# Patient Record
Sex: Male | Born: 1980 | Race: Black or African American | Hispanic: No | Marital: Single | Smoking: Never smoker
Health system: Southern US, Community
[De-identification: ages and names within clinical notes are randomized; demographics above are authoritative.]

## PROBLEM LIST (undated history)

## (undated) DIAGNOSIS — Z789 Other specified health status: Secondary | ICD-10-CM

## (undated) DIAGNOSIS — I2699 Other pulmonary embolism without acute cor pulmonale: Secondary | ICD-10-CM

## (undated) HISTORY — PX: WRIST FRACTURE SURGERY: SHX121

## (undated) HISTORY — PX: APPENDECTOMY: SHX54

## (undated) HISTORY — PX: OTHER SURGICAL HISTORY: SHX169

---

## 2010-04-03 ENCOUNTER — Inpatient Hospital Stay (HOSPITAL_COMMUNITY): Admission: EM | Admit: 2010-04-03 | Discharge: 2010-04-04 | Payer: Self-pay | Admitting: Emergency Medicine

## 2010-04-03 ENCOUNTER — Encounter (INDEPENDENT_AMBULATORY_CARE_PROVIDER_SITE_OTHER): Payer: Self-pay | Admitting: General Surgery

## 2010-12-30 LAB — URINALYSIS, ROUTINE W REFLEX MICROSCOPIC
Glucose, UA: NEGATIVE mg/dL
Hgb urine dipstick: NEGATIVE
pH: 8.5 — ABNORMAL HIGH (ref 5.0–8.0)

## 2010-12-30 LAB — CBC
Platelets: 138 10*3/uL — ABNORMAL LOW (ref 150–400)
RBC: 5.72 MIL/uL (ref 4.22–5.81)
RDW: 14 % (ref 11.5–15.5)
WBC: 15.2 10*3/uL — ABNORMAL HIGH (ref 4.0–10.5)

## 2010-12-30 LAB — COMPREHENSIVE METABOLIC PANEL
ALT: 29 U/L (ref 0–53)
Albumin: 4.2 g/dL (ref 3.5–5.2)
BUN: 9 mg/dL (ref 6–23)
CO2: 23 mEq/L (ref 19–32)
Calcium: 9.8 mg/dL (ref 8.4–10.5)
Creatinine, Ser: 1.08 mg/dL (ref 0.4–1.5)
GFR calc non Af Amer: >60 mL/min (ref >60)
Glucose, Bld: 132 mg/dL — ABNORMAL HIGH (ref 70–99)

## 2010-12-30 LAB — DIFFERENTIAL
Basophils Absolute: 0 10*3/uL (ref 0.0–0.1)
Basophils Relative: 0 % (ref 0–1)
Eosinophils Absolute: 0 10*3/uL (ref 0.0–0.7)
Eosinophils Relative: 0 % (ref 0–5)
Lymphocytes Relative: 6 % — ABNORMAL LOW (ref 12–46)
Monocytes Relative: 6 % (ref 3–12)
Neutrophils Relative %: 88 % — ABNORMAL HIGH (ref 43–77)

## 2016-01-08 ENCOUNTER — Emergency Department: Payer: Federal, State, Local not specified - PPO

## 2016-01-08 ENCOUNTER — Emergency Department
Admission: EM | Admit: 2016-01-08 | Discharge: 2016-01-08 | Disposition: A | Discharge status: Home / Self Care | Payer: Federal, State, Local not specified - PPO | Attending: Emergency Medicine | Admitting: Emergency Medicine

## 2016-01-08 ENCOUNTER — Encounter: Payer: Self-pay | Admitting: Emergency Medicine

## 2016-01-08 DIAGNOSIS — S5011XA Contusion of right forearm, initial encounter: Secondary | ICD-10-CM | POA: Insufficient documentation

## 2016-01-08 DIAGNOSIS — S52201A Unspecified fracture of shaft of right ulna, initial encounter for closed fracture: Secondary | ICD-10-CM

## 2016-01-08 DIAGNOSIS — Y998 Other external cause status: Secondary | ICD-10-CM | POA: Diagnosis not present

## 2016-01-08 DIAGNOSIS — S52251A Displaced comminuted fracture of shaft of ulna, right arm, initial encounter for closed fracture: Secondary | ICD-10-CM | POA: Insufficient documentation

## 2016-01-08 DIAGNOSIS — Y9389 Activity, other specified: Secondary | ICD-10-CM | POA: Diagnosis not present

## 2016-01-08 DIAGNOSIS — S52351A Displaced comminuted fracture of shaft of radius, right arm, initial encounter for closed fracture: Secondary | ICD-10-CM | POA: Diagnosis not present

## 2016-01-08 DIAGNOSIS — Y92410 Unspecified street and highway as the place of occurrence of the external cause: Secondary | ICD-10-CM | POA: Insufficient documentation

## 2016-01-08 DIAGNOSIS — S50811A Abrasion of right forearm, initial encounter: Secondary | ICD-10-CM | POA: Insufficient documentation

## 2016-01-08 DIAGNOSIS — S59911A Unspecified injury of right forearm, initial encounter: Secondary | ICD-10-CM | POA: Diagnosis present

## 2016-01-08 DIAGNOSIS — S5291XA Unspecified fracture of right forearm, initial encounter for closed fracture: Secondary | ICD-10-CM

## 2016-01-08 MED ORDER — MORPHINE SULFATE (PF) 4 MG/ML IV SOLN
4.0000 mg | Freq: Once | INTRAVENOUS | Status: AC
  Administered 2016-01-08: 4 mg via INTRAVENOUS

## 2016-01-08 MED ORDER — MORPHINE SULFATE (PF) 4 MG/ML IV SOLN
INTRAVENOUS | Status: AC
  Filled 2016-01-08: qty 1

## 2016-01-08 MED ORDER — OXYCODONE-ACETAMINOPHEN 5-325 MG PO TABS
1.0000 | ORAL_TABLET | Freq: Once | ORAL | Status: AC
  Administered 2016-01-08: 1 via ORAL
  Filled 2016-01-08: qty 1

## 2016-01-08 MED ORDER — SODIUM CHLORIDE 0.9 % IV BOLUS (SEPSIS)
1000.0000 mL | Freq: Once | INTRAVENOUS | Status: AC
  Administered 2016-01-08: 1000 mL via INTRAVENOUS

## 2016-01-08 MED ORDER — ONDANSETRON HCL 4 MG/2ML IJ SOLN
4.0000 mg | Freq: Once | INTRAMUSCULAR | Status: AC
  Administered 2016-01-08: 4 mg via INTRAVENOUS

## 2016-01-08 MED ORDER — OXYCODONE-ACETAMINOPHEN 5-325 MG PO TABS: 1.0000 | ORAL_TABLET | Freq: Four times a day (QID) | ORAL | Status: DC | PRN

## 2016-01-08 MED ORDER — ONDANSETRON HCL 4 MG/2ML IJ SOLN
INTRAMUSCULAR | Status: AC
  Filled 2016-01-08: qty 2
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
## 2016-01-08 NOTE — ED Notes (Signed)
Pt right arm immobilized in triage.
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
## 2016-01-08 NOTE — Discharge Instructions (Signed)
Cast or Splint Care °Casts and splints support injured limbs and keep bones from moving while they heal. It is important to care for your cast or splint at home.   °HOME CARE INSTRUCTIONS °· Keep the cast or splint uncovered during the drying period. It can take 24 to 48 hours to dry if it is made of plaster. A fiberglass cast will dry in less than 1 hour. °· Do not rest the cast on anything harder than a pillow for the first 24 hours. °· Do not put weight on your injured limb or apply pressure to the cast until your health care provider gives you permission. °· Keep the cast or splint dry. Wet casts or splints can lose their shape and may not support the limb as well. A wet cast that has lost its shape can also create harmful pressure on your skin when it dries. Also, wet skin can become infected. °· Cover the cast or splint with a plastic bag when bathing or when out in the rain or snow. If the cast is on the trunk of the body, take sponge baths until the cast is removed. °· If your cast does become wet, dry it with a towel or a blow dryer on the cool setting only. °· Keep your cast or splint clean. Soiled casts may be wiped with a moistened cloth. °· Do not place any hard or soft foreign objects under your cast or splint, such as cotton, toilet paper, lotion, or powder. °· Do not try to scratch the skin under the cast with any object. The object could get stuck inside the cast. Also, scratching could lead to an infection. If itching is a problem, use a blow dryer on a cool setting to relieve discomfort. °· Do not trim or cut your cast or remove padding from inside of it. °· Exercise all joints next to the injury that are not immobilized by the cast or splint. For example, if you have a long leg cast, exercise the hip joint and toes. If you have an arm cast or splint, exercise the shoulder, elbow, thumb, and fingers. °· Elevate your injured arm or leg on 1 or 2 pillows for the first 1 to 3 days to decrease  swelling and pain. It is best if you can comfortably elevate your cast so it is higher than your heart. °SEEK MEDICAL CARE IF:  °· Your cast or splint cracks. °· Your cast or splint is too tight or too loose. °· You have unbearable itching inside the cast. °· Your cast becomes wet or develops a soft spot or area. °· You have a bad smell coming from inside your cast. °· You get an object stuck under your cast. °· Your skin around the cast becomes red or raw. °· You have new pain or worsening pain after the cast has been applied. °SEEK IMMEDIATE MEDICAL CARE IF:  °· You have fluid leaking through the cast. °· You are unable to move your fingers or toes. °· You have discolored (blue or white), cool, painful, or very swollen fingers or toes beyond the cast. °· You have tingling or numbness around the injured area. °· You have severe pain or pressure under the cast. °· You have any difficulty with your breathing or have shortness of breath. °· You have chest pain. °  °This information is not intended to replace advice given to you by your health care provider. Make sure you discuss any questions you have with your health care   provider. °  °Document Released: 09/27/2000 Document Revised: 07/21/2013 Document Reviewed: 04/08/2013 °Elsevier Interactive Patient Education ©2016 [employer] ° °Forearm Fracture °A forearm fracture is a break in one or both of the bones of your arm that are between the elbow and the wrist. Your forearm is made up of two bones: °· Radius. This is the bone on the inside of your arm near your thumb. °· Ulna. This is the bone on the outside of your arm near your little finger. °Middle forearm fractures usually break both the radius and the ulna. Most forearm fractures that involve both the ulna and radius will require surgery. °CAUSES °Common causes of this type of fracture include: °· Falling on an outstretched arm. °· Accidents, such as a car or bike accident. °· A hard, direct hit to the middle  part of your arm. °RISK FACTORS °You may be at higher risk for this type of fracture if: °· You play contact sports. °· You have a condition that causes your bones to be weak or thin (osteoporosis). °SIGNS AND SYMPTOMS °A forearm fracture causes pain immediately after the injury. Other signs and symptoms include: °· An abnormal bend or bump in your arm (deformity). °· Swelling. °· Numbness or tingling. °· Tenderness. °· Inability to turn your hand from side to side (rotate). °· Bruising. °DIAGNOSIS °Your health care provider may diagnose a forearm fracture based on: °· Your symptoms. °· Your medical history, including any recent injury. °· A physical exam. Your health care provider will look for any deformity and feel for tenderness over the break. Your health care provider will also check whether the bones are out of place. °· An X-ray exam to confirm the diagnosis and learn more about the type of fracture. °TREATMENT °The goals of treatment are to get the bone or bones in proper position for healing and to keep the bones from moving so they will heal over time. Your treatment will depend on many factors, especially the type of fracture that you have. °· If the fractured bone or bones: °¨ Are in the correct position (nondisplaced), you may only need to wear a cast or a splint. °¨ Have a slightly displaced fracture, you may need to have the bones moved back into place manually (closed reduction) before the splint or cast is put on. °· You may have a temporary splint before you have a cast. The splint allows room for some swelling. After a few days, a cast can replace the splint. °· You may have to wear the cast for 6-8 weeks or as directed by your health care provider. °· The cast may be changed after about 3 weeks or as directed by your health care provider. °· After your cast is removed, you may need physical therapy to regain full movement in your wrist or elbow. °· You may need emergency surgery if you  have: °¨ A fractured bone or bones that are out of position (displaced). °¨ A fracture with multiple fragments (comminuted fracture). °¨ A fracture that breaks the skin (open fracture). This type of fracture may require surgical wires, plates, or screws to hold the bone or bones in place. °· You may have X-rays every couple of weeks to check on your healing. °HOME CARE INSTRUCTIONS °If You Have a Cast: °· Do not stick anything inside the cast to scratch your skin. Doing that increases your risk of infection. °· Check the skin around the cast every day. Report any concerns to your health care   provider. You may put lotion on dry skin around the edges of the cast. Do not apply lotion to the skin underneath the cast. °If You Have a Splint: °· Wear it as directed by your health care provider. Remove it only as directed by your health care provider. °· Loosen the splint if your fingers become numb and tingle, or if they turn cold and blue. °Bathing °· Cover the cast or splint with a watertight plastic bag to protect it from water while you bathe or shower. Do not let the cast or splint get wet. °Managing Pain, Stiffness, and Swelling °· If directed, apply ice to the injured area: °¨ Put ice in a plastic bag. °¨ Place a towel between your skin and the bag. °¨ Leave the ice on for 20 minutes, 2-3 times a day. °· Move your fingers often to avoid stiffness and to lessen swelling. °· Raise the injured area above the level of your heart while you are sitting or lying down. °Driving °· Do not drive or operate heavy machinery while taking pain medicine. °· Do not drive while wearing a cast or splint on a hand that you use for driving. °Activity °· Return to your normal activities as directed by your health care provider. Ask your health care provider what activities are safe for you. °· Perform range-of-motion exercises only as directed by your health care provider. °Safety °· Do not use your injured limb to support your body  weight until your health care provider says that you can. °General Instructions °· Do not put pressure on any part of the cast or splint until it is fully hardened. This may take several hours. °· Keep the cast or splint clean and dry. °· Do not use any tobacco products, including cigarettes, chewing tobacco, or electronic cigarettes. Tobacco can delay bone healing. If you need help quitting, ask your health care provider. °· Take medicines only as directed by your health care provider. °· Keep all follow-up visits as directed by your health care provider. This is important. °SEEK MEDICAL CARE IF: °· Your pain medicine is not helping. °· Your cast or splint becomes wet or damaged or suddenly feels too tight. °· Your cast becomes loose. °· You have more severe pain or swelling than you did before the cast. °· You have severe pain when you stretch your fingers. °· You continue to have pain or stiffness in your elbow or your wrist after your cast is removed. °SEEK IMMEDIATE MEDICAL CARE IF: °· You cannot move your fingers. °· You lose feeling in your fingers or your hand. °· Your hand or your fingers turn cold and pale or blue. °· You notice a bad smell coming from your cast. °· You have drainage from underneath your cast. °· You have new stains from blood or drainage that is coming through your cast. °  °This information is not intended to replace advice given to you by your health care provider. Make sure you discuss any questions you have with your health care provider. °  °Document Released: 09/27/2000 Document Revised: 10/21/2014 Document Reviewed: 05/16/2014 °Elsevier Interactive Patient Education ©2016 [employer] Inc. ° °

## 2016-01-10 ENCOUNTER — Ambulatory Visit: Payer: Federal, State, Local not specified - PPO

## 2016-01-10 ENCOUNTER — Encounter
Admission: RE | Disposition: A | Discharge status: Home / Self Care | Payer: Self-pay | Source: Ambulatory Visit | Attending: Orthopedic Surgery

## 2016-01-10 ENCOUNTER — Ambulatory Visit: Payer: Federal, State, Local not specified - PPO | Admitting: Certified Registered Nurse Anesthetist

## 2016-01-10 ENCOUNTER — Encounter: Payer: Self-pay | Admitting: *Deleted

## 2016-01-10 ENCOUNTER — Ambulatory Visit
Admission: RE | Admit: 2016-01-10 | Discharge: 2016-01-12 | Disposition: A | Discharge status: Home / Self Care | Payer: Federal, State, Local not specified - PPO | Source: Ambulatory Visit | Attending: Orthopedic Surgery | Admitting: Orthopedic Surgery

## 2016-01-10 DIAGNOSIS — Z79891 Long term (current) use of opiate analgesic: Secondary | ICD-10-CM | POA: Insufficient documentation

## 2016-01-10 DIAGNOSIS — Z9889 Other specified postprocedural states: Secondary | ICD-10-CM | POA: Diagnosis not present

## 2016-01-10 DIAGNOSIS — Z86711 Personal history of pulmonary embolism: Secondary | ICD-10-CM | POA: Diagnosis not present

## 2016-01-10 DIAGNOSIS — Z9049 Acquired absence of other specified parts of digestive tract: Secondary | ICD-10-CM | POA: Diagnosis not present

## 2016-01-10 DIAGNOSIS — S52301A Unspecified fracture of shaft of right radius, initial encounter for closed fracture: Secondary | ICD-10-CM | POA: Diagnosis not present

## 2016-01-10 DIAGNOSIS — S5291XA Unspecified fracture of right forearm, initial encounter for closed fracture: Secondary | ICD-10-CM

## 2016-01-10 DIAGNOSIS — S52201A Unspecified fracture of shaft of right ulna, initial encounter for closed fracture: Secondary | ICD-10-CM | POA: Diagnosis present

## 2016-01-10 HISTORY — PX: ORIF ULNAR FRACTURE: SHX5417

## 2016-01-10 HISTORY — DX: Other pulmonary embolism without acute cor pulmonale: I26.99

## 2016-01-10 SURGERY — OPEN REDUCTION INTERNAL FIXATION (ORIF) ULNAR FRACTURE
Anesthesia: General | Laterality: Right

## 2016-01-10 MED ORDER — ONDANSETRON HCL 4 MG/2ML IJ SOLN: 4.0000 mg | Freq: Once | INTRAMUSCULAR | Status: DC | PRN

## 2016-01-10 MED ORDER — CEFAZOLIN SODIUM 1-5 GM-% IV SOLN
INTRAVENOUS | Status: AC
  Filled 2016-01-10: qty 50

## 2016-01-10 MED ORDER — ONDANSETRON HCL 4 MG/2ML IJ SOLN
INTRAMUSCULAR | Status: DC | PRN
  Administered 2016-01-10 (×2): 4 mg via INTRAVENOUS

## 2016-01-10 MED ORDER — FENTANYL CITRATE (PF) 100 MCG/2ML IJ SOLN
25.0000 ug | INTRAMUSCULAR | Status: DC | PRN
  Administered 2016-01-10 (×4): 25 ug via INTRAVENOUS

## 2016-01-10 MED ORDER — METOCLOPRAMIDE HCL 10 MG PO TABS
5.0000 mg | ORAL_TABLET | Freq: Three times a day (TID) | ORAL | Status: DC | PRN
Start: 2016-01-10 — End: 2016-01-12

## 2016-01-10 MED ORDER — ACETAMINOPHEN 10 MG/ML IV SOLN
INTRAVENOUS | Status: DC | PRN
  Administered 2016-01-10: 1000 mg via INTRAVENOUS

## 2016-01-10 MED ORDER — ACETAMINOPHEN 10 MG/ML IV SOLN
INTRAVENOUS | Status: AC
  Filled 2016-01-10: qty 100

## 2016-01-10 MED ORDER — SODIUM CHLORIDE 0.9 % IV SOLN
INTRAVENOUS | Status: DC
  Administered 2016-01-10 – 2016-01-11 (×2): via INTRAVENOUS
  Administered 2016-01-12: 75 mL/h via INTRAVENOUS

## 2016-01-10 MED ORDER — OXYCODONE-ACETAMINOPHEN 5-325 MG PO TABS
1.0000 | ORAL_TABLET | ORAL | Status: DC | PRN
  Administered 2016-01-11 (×2): 1 via ORAL
  Filled 2016-01-10 (×2): qty 1

## 2016-01-10 MED ORDER — FAMOTIDINE 20 MG PO TABS
ORAL_TABLET | ORAL | Status: AC
  Administered 2016-01-10: 20 mg via ORAL
  Filled 2016-01-10: qty 1

## 2016-01-10 MED ORDER — FENTANYL CITRATE (PF) 100 MCG/2ML IJ SOLN
INTRAMUSCULAR | Status: AC
  Filled 2016-01-10: qty 2

## 2016-01-10 MED ORDER — METOCLOPRAMIDE HCL 5 MG/ML IJ SOLN: 5.0000 mg | Freq: Three times a day (TID) | INTRAMUSCULAR | Status: DC | PRN

## 2016-01-10 MED ORDER — PROPOFOL 10 MG/ML IV BOLUS
INTRAVENOUS | Status: DC | PRN
  Administered 2016-01-10: 180 mg via INTRAVENOUS

## 2016-01-10 MED ORDER — BUPIVACAINE HCL (PF) 0.25 % IJ SOLN
INTRAMUSCULAR | Status: AC
  Filled 2016-01-10: qty 30

## 2016-01-10 MED ORDER — HYDROMORPHONE HCL 1 MG/ML IJ SOLN
0.2500 mg | INTRAMUSCULAR | Status: DC | PRN
  Administered 2016-01-10 (×2): 0.5 mg via INTRAVENOUS

## 2016-01-10 MED ORDER — MORPHINE SULFATE (PF) 2 MG/ML IV SOLN
1.0000 mg | INTRAVENOUS | Status: DC | PRN
  Administered 2016-01-10: 1 mg via INTRAVENOUS
  Filled 2016-01-10: qty 1

## 2016-01-10 MED ORDER — HYDROMORPHONE HCL 1 MG/ML IJ SOLN: 0.2500 mg | INTRAMUSCULAR | Status: DC | PRN

## 2016-01-10 MED ORDER — LIDOCAINE HCL (CARDIAC) 20 MG/ML IV SOLN
INTRAVENOUS | Status: DC | PRN
Start: 2016-01-10 — End: 2016-01-10
  Administered 2016-01-10: 100 mg via INTRAVENOUS

## 2016-01-10 MED ORDER — OXYCODONE HCL 5 MG PO TABS: 5.0000 mg | ORAL_TABLET | ORAL | Status: DC | PRN

## 2016-01-10 MED ORDER — PHENYLEPHRINE HCL 10 MG/ML IJ SOLN
INTRAMUSCULAR | Status: DC | PRN
  Administered 2016-01-10 (×2): 50 ug via INTRAVENOUS

## 2016-01-10 MED ORDER — BUPIVACAINE HCL (PF) 0.25 % IJ SOLN: INTRAMUSCULAR | Status: DC | PRN

## 2016-01-10 MED ORDER — NEOMYCIN-POLYMYXIN B GU 40-200000 IR SOLN
Status: AC
  Filled 2016-01-10: qty 2

## 2016-01-10 MED ORDER — FENTANYL CITRATE (PF) 100 MCG/2ML IJ SOLN: 25.0000 ug | INTRAMUSCULAR | Status: DC | PRN

## 2016-01-10 MED ORDER — FAMOTIDINE 20 MG PO TABS
20.0000 mg | ORAL_TABLET | Freq: Once | ORAL | Status: AC
  Administered 2016-01-10: 20 mg via ORAL

## 2016-01-10 MED ORDER — FENTANYL CITRATE (PF) 100 MCG/2ML IJ SOLN
INTRAMUSCULAR | Status: DC | PRN
  Administered 2016-01-10: 50 ug via INTRAVENOUS
  Administered 2016-01-10: 75 ug via INTRAVENOUS
  Administered 2016-01-10: 50 ug via INTRAVENOUS
  Administered 2016-01-10: 100 ug via INTRAVENOUS
  Administered 2016-01-10 (×3): 50 ug via INTRAVENOUS
  Administered 2016-01-10: 25 ug via INTRAVENOUS
  Administered 2016-01-10 (×4): 50 ug via INTRAVENOUS
  Administered 2016-01-10 (×2): 25 ug via INTRAVENOUS

## 2016-01-10 MED ORDER — OXYCODONE HCL 5 MG/5ML PO SOLN: 5.0000 mg | Freq: Once | ORAL | Status: AC | PRN

## 2016-01-10 MED ORDER — LACTATED RINGERS IV SOLN
INTRAVENOUS | Status: DC
  Administered 2016-01-10 (×3): via INTRAVENOUS

## 2016-01-10 MED ORDER — OXYCODONE HCL 5 MG PO TABS
5.0000 mg | ORAL_TABLET | Freq: Once | ORAL | Status: AC | PRN
  Administered 2016-01-11: 5 mg via ORAL
  Filled 2016-01-10: qty 1

## 2016-01-10 MED ORDER — HYDROMORPHONE HCL 1 MG/ML IJ SOLN
INTRAMUSCULAR | Status: AC
  Filled 2016-01-10: qty 1

## 2016-01-10 MED ORDER — ONDANSETRON HCL 4 MG PO TABS: 4.0000 mg | ORAL_TABLET | Freq: Four times a day (QID) | ORAL | Status: DC | PRN

## 2016-01-10 MED ORDER — CEFAZOLIN SODIUM 1-5 GM-% IV SOLN
1.0000 g | Freq: Once | INTRAVENOUS | Status: AC
  Administered 2016-01-10: 1 g via INTRAVENOUS
  Administered 2016-01-10: 2 g via INTRAVENOUS

## 2016-01-10 MED ORDER — ONDANSETRON HCL 4 MG/2ML IJ SOLN: 4.0000 mg | Freq: Four times a day (QID) | INTRAMUSCULAR | Status: DC | PRN

## 2016-01-10 MED ORDER — MIDAZOLAM HCL 2 MG/2ML IJ SOLN
INTRAMUSCULAR | Status: DC | PRN
  Administered 2016-01-10: 2 mg via INTRAVENOUS

## 2016-01-10 SURGICAL SUPPLY — 50 items
BANDAGE ACE 3X5.8 VEL STRL LF (GAUZE/BANDAGES/DRESSINGS) ×4 IMPLANT
BNDG COHESIVE 4X5 TAN STRL (GAUZE/BANDAGES/DRESSINGS) ×2 IMPLANT
BNDG ESMARK 4X12 TAN STRL LF (GAUZE/BANDAGES/DRESSINGS) ×2 IMPLANT
CANISTER SUCT 1200ML W/VALVE (MISCELLANEOUS) ×2 IMPLANT
COOLER POLAR GLACIER W/PUMP (MISCELLANEOUS) ×2 IMPLANT
DRAPE FLUOR MINI C-ARM 54X84 (DRAPES) ×2 IMPLANT
DRSG DERMACEA 8X12 NADH (GAUZE/BANDAGES/DRESSINGS) ×2 IMPLANT
DRSG TEGADERM 4X10 (GAUZE/BANDAGES/DRESSINGS) ×2 IMPLANT
DURAPREP 26ML APPLICATOR (WOUND CARE) ×2 IMPLANT
ELECT REM PT RETURN 9FT ADLT (ELECTROSURGICAL) ×2
ELECTRODE REM PT RTRN 9FT ADLT (ELECTROSURGICAL) ×1 IMPLANT
GAUZE SPONGE 4X4 12PLY STRL (GAUZE/BANDAGES/DRESSINGS) ×6 IMPLANT
GLOVE BIOGEL M STRL SZ7.5 (GLOVE) ×2 IMPLANT
GLOVE INDICATOR 8.0 STRL GRN (GLOVE) ×2 IMPLANT
GOWN STRL REUS W/ TWL LRG LVL3 (GOWN DISPOSABLE) ×2 IMPLANT
GOWN STRL REUS W/TWL LRG LVL3 (GOWN DISPOSABLE) ×2
NEEDLE FILTER BLUNT 18X 1/2SAF (NEEDLE) ×1
NEEDLE FILTER BLUNT 18X1 1/2 (NEEDLE) ×1 IMPLANT
NS IRRIG 500ML POUR BTL (IV SOLUTION) ×2 IMPLANT
PACK EXTREMITY ARMC (MISCELLANEOUS) ×2 IMPLANT
PAD CAST CTTN 4X4 STRL (SOFTGOODS) ×2 IMPLANT
PAD WRAPON POLAR ANKLE (MISCELLANEOUS) ×1 IMPLANT
PADDING CAST COTTON 4X4 STRL (SOFTGOODS) ×2
PROS LCP PLATE 8H 111M (Plate) ×2 IMPLANT
PROS LCP PLATE 9H 124M (Plate) ×2 IMPLANT
PROSTHESIS LCP PLATE 8H 111M (Plate) ×1 IMPLANT
PROSTHESIS LCP PLATE 9H 124M (Plate) ×1 IMPLANT
SCREW CORTEX 3.5 14MM (Screw) ×4 IMPLANT
SCREW CORTEX 3.5 16MM (Screw) ×6 IMPLANT
SCREW CORTEX 3.5 18MM (Screw) ×4 IMPLANT
SCREW CORTEX 3.5 22MM (Screw) ×1 IMPLANT
SCREW LOCK CORT ST 3.5X14 (Screw) ×4 IMPLANT
SCREW LOCK CORT ST 3.5X16 (Screw) ×6 IMPLANT
SCREW LOCK CORT ST 3.5X18 (Screw) ×4 IMPLANT
SCREW LOCK CORT ST 3.5X22 (Screw) ×1 IMPLANT
SPLINT CAST 1 STEP 3X12 (MISCELLANEOUS) IMPLANT
SPLINT CAST 1 STEP 5X30 WHT (MISCELLANEOUS) ×2 IMPLANT
SPONGE LAP 18X18 5 PK (GAUZE/BANDAGES/DRESSINGS) ×2 IMPLANT
STAPLER SKIN PROX 35W (STAPLE) ×2 IMPLANT
STOCKINETTE STRL 4IN 9604848 (GAUZE/BANDAGES/DRESSINGS) ×2 IMPLANT
STRAP SAFETY BODY (MISCELLANEOUS) ×2 IMPLANT
STRIP CLOSURE SKIN 1/2X4 (GAUZE/BANDAGES/DRESSINGS) IMPLANT
SUT VIC AB 0 CT2 27 (SUTURE) ×4 IMPLANT
SUT VIC AB 2-0 SH 27 (SUTURE) ×2
SUT VIC AB 2-0 SH 27XBRD (SUTURE) ×2 IMPLANT
SUT VIC AB 3-0 SH 27 (SUTURE) ×1
SUT VIC AB 3-0 SH 27X BRD (SUTURE) ×1 IMPLANT
SUT VIC AB 4-0 FS2 27 (SUTURE) ×2 IMPLANT
SYRINGE 10CC LL (SYRINGE) ×2 IMPLANT
WRAPON POLAR PAD ANKLE (MISCELLANEOUS) ×2

## 2016-01-10 NOTE — H&P (Signed)
The patient has been re-examined, and the chart reviewed, and there have been no interval changes to the documented history and physical.    The risks, benefits, and alternatives have been discussed at length. The patient expressed understanding of the risks benefits and agreed with plans for surgical intervention.  James P. Hooten, Jr. M.D.    

## 2016-01-10 NOTE — Anesthesia Preprocedure Evaluation (Addendum)
Anesthesia Evaluation  Patient identified by MRN, date of birth, ID band Patient awake    Reviewed: Allergy & Precautions, NPO status , Patient's Chart, lab work & pertinent test results  History of Anesthesia Complications Negative for: history of anesthetic complications  Airway Mallampati: II       Dental   Pulmonary neg pulmonary ROS,           Cardiovascular negative cardio ROS       Neuro/Psych negative neurological ROS     GI/Hepatic negative GI ROS, Neg liver ROS,   Endo/Other  negative endocrine ROS  Renal/GU negative Renal ROS     Musculoskeletal   Abdominal   Peds  Hematology negative hematology ROS (+)   Anesthesia Other Findings   Reproductive/Obstetrics                             Anesthesia Physical Anesthesia Plan  ASA: I  Anesthesia Plan: General   Post-op Pain Management:    Induction: Intravenous  Airway Management Planned: LMA  Additional Equipment:   Intra-op Plan:   Post-operative Plan:   Informed Consent: I have reviewed the patients History and Physical, chart, labs and discussed the procedure including the risks, benefits and alternatives for the proposed anesthesia with the patient or authorized representative who has indicated his/her understanding and acceptance.     Plan Discussed with:   Anesthesia Plan Comments:         Anesthesia Quick Evaluation

## 2016-01-10 NOTE — Brief Op Note (Signed)
01/10/2016  8:55 PM  PATIENT:  Kevin Decker  35 y.o. male  PRE-OPERATIVE DIAGNOSIS:  Right radial and ulnar shaft fractures, comminuted  POST-OPERATIVE DIAGNOSIS:  Same  PROCEDURE:  Procedure(s): OPEN REDUCTION INTERNAL FIXATION (ORIF) ULNAR/RADIUS FRACTURE (Right)  SURGEON:  Surgeon(s) and Role:    * Donato HeinzJames P Javari Bufkin, MD - Primary  ASSISTANTS: none   ANESTHESIA:   general  EBL: 100 mL  Fluids replaced: 2500 mL of crystalloid  BLOOD ADMINISTERED:none  DRAINS: none   LOCAL MEDICATIONS USED:  NONE  SPECIMEN:  No Specimen  DISPOSITION OF SPECIMEN:  N/A  COUNTS:  YES  TOURNIQUET:  1-128 minutes   2-101 minutes   DICTATION: .Dragon Dictation  PLAN OF CARE: Admit for overnight observation  PATIENT DISPOSITION:  PACU - hemodynamically stable.   Delay start of Pharmacological VTE agent (>24hrs) due to surgical blood loss or risk of bleeding: not applicable

## 2016-01-10 NOTE — Transfer of Care (Signed)
Immediate Anesthesia Transfer of Care Note  Patient: Kevin Decker  Procedure(s) Performed: Procedure(s): OPEN REDUCTION INTERNAL FIXATION (ORIF) ULNAR/RADIUS FRACTURE (Right)  Patient Location: PACU  Anesthesia Type:General  Level of Consciousness: sedated  Airway & Oxygen Therapy: Patient Spontanous Breathing and Patient connected to face mask oxygen  Post-op Assessment: Report given to RN and Post -op Vital signs reviewed and stable  Post vital signs: Reviewed and stable  Last Vitals:  Filed Vitals:   01/10/16 1150  BP: 138/65  Pulse: 102  Temp: 36.9 C  Resp: 16    Complications: No apparent anesthesia complications
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
## 2016-01-10 NOTE — Anesthesia Procedure Notes (Signed)
Procedure Name: LMA Insertion Performed by: Norvella Loscalzo Pre-anesthesia Checklist: Patient identified, Patient being monitored, Timeout performed, Emergency Drugs available and Suction available Patient Re-evaluated:Patient Re-evaluated prior to inductionOxygen Delivery Method: Circle system utilized Preoxygenation: Pre-oxygenation with 100% oxygen Intubation Type: IV induction Ventilation: Mask ventilation without difficulty LMA: LMA inserted LMA Size: 5.0 Tube type: Oral Number of attempts: 1 Placement Confirmation: positive ETCO2 and breath sounds checked- equal and bilateral Tube secured with: Tape Dental Injury: Teeth and Oropharynx as per pre-operative assessment      

## 2016-01-11 ENCOUNTER — Encounter: Payer: Self-pay | Admitting: Orthopedic Surgery

## 2016-01-11 DIAGNOSIS — S52301A Unspecified fracture of shaft of right radius, initial encounter for closed fracture: Secondary | ICD-10-CM | POA: Diagnosis not present

## 2016-01-11 MED ORDER — ACETAMINOPHEN 10 MG/ML IV SOLN
1000.0000 mg | Freq: Four times a day (QID) | INTRAVENOUS | Status: AC
  Administered 2016-01-11 – 2016-01-12 (×4): 1000 mg via INTRAVENOUS
  Filled 2016-01-11 (×4): qty 100

## 2016-01-11 MED ORDER — MORPHINE SULFATE (PF) 2 MG/ML IV SOLN
1.0000 mg | INTRAVENOUS | Status: DC | PRN
Start: 2016-01-11 — End: 2016-01-12
  Administered 2016-01-11 (×4): 2 mg via INTRAVENOUS
  Filled 2016-01-11 (×4): qty 1

## 2016-01-11 MED ORDER — TRAMADOL HCL 50 MG PO TABS
50.0000 mg | ORAL_TABLET | ORAL | Status: DC | PRN
Start: 2016-01-11 — End: 2016-01-12
  Administered 2016-01-11 (×2): 50 mg via ORAL
  Administered 2016-01-12: 100 mg via ORAL
  Filled 2016-01-11 (×2): qty 1
  Filled 2016-01-11: qty 2

## 2016-01-11 MED ORDER — OXYCODONE HCL 5 MG PO TABS
5.0000 mg | ORAL_TABLET | ORAL | Status: DC | PRN
  Administered 2016-01-11: 5 mg via ORAL
  Administered 2016-01-11 – 2016-01-12 (×3): 10 mg via ORAL
  Filled 2016-01-11: qty 2
  Filled 2016-01-11: qty 1
  Filled 2016-01-11 (×2): qty 2

## 2016-01-11 NOTE — Progress Notes (Signed)
Pt. Crying in pain and hand is cool. Pulse is strong and pt. Has purposeful movement. Morphine 2mg  administered and Dr. Rosita KeaMenz notified. Dr. Rosita KeaMenz on the way to see pt.

## 2016-01-11 NOTE — Progress Notes (Signed)
ORTHOPEDICS: Patient was resting comfortably this evening but awakened easily. He states that the tingling has improved significantly. He denies any numbness at this time.  Posterior splint is intact to the right upper extremity. The hand and digits are warm. Good capillary refill. Motor function is intact. The patient demonstrates good discrimination to light touch.  Status post ORIF of right radial and ulnar shaft fractures  Pain and paresthesias have improved significantly. No evidence of compartment syndrome. Continue with current pain management regimen.  James P. Angie FavaHooten, Jr. M.D.

## 2016-01-11 NOTE — Progress Notes (Signed)
New order per Van ClinesJon Wolfe, Regular diet

## 2016-01-11 NOTE — Progress Notes (Signed)
   Subjective: 1 Day Post-Op Procedure(s) (LRB): OPEN REDUCTION INTERNAL FIXATION (ORIF) ULNAR/RADIUS FRACTURE (Right) Patient reports pain as 10 on 0-10 scale.   Patient is well, and has had no acute complaints or problems  Plan is to go Home after hospital stay. no nausea and no vomiting Patient denies any chest pains or shortness of breath. Objective: Vital signs in last 24 hours: Temp:  [97 F (36.1 C)-100 F (37.8 C)] 100 F (37.8 C) (03/30 0742) Pulse Rate:  [75-102] 75 (03/30 0742) Resp:  [0-19] 18 (03/30 0742) BP: (121-153)/(65-99) 151/77 mmHg (03/30 0742) SpO2:  [96 %-100 %] 100 % (03/30 0742) FiO2 (%):  [21 %] 21 % (03/29 2139) Weight:  [102.059 kg (225 lb)] 102.059 kg (225 lb) (03/29 1150) well approximated incision Heels are non tender and elevated off the bed using rolled towels Intake/Output from previous day: 03/29 0701 - 03/30 0700 In: 3091.3 [I.V.:3091.3] Out: 1000 [Urine:900; Blood:100] Intake/Output this shift:    No results for input(s): HGB in the last 72 hours. No results for input(s): WBC, RBC, HCT, PLT in the last 72 hours. No results for input(s): NA, K, CL, CO2, BUN, CREATININE, GLUCOSE, CALCIUM in the last 72 hours. No results for input(s): LABPT, INR in the last 72 hours.  EXAM General - Patient is Alert, Appropriate and Oriented Extremity - Neurologically intact Neurovascular intact Sensation intact distally Intact pulses distally  Pt appears to be inconsistant on touch. Has very good motor function of the fingers.  Dressing - scant drainage Motor Function - intact, moving fingers well on exam.    Past Medical History  Diagnosis Date  . Pulmonary embolism (HCC)     Assessment/Plan: 1 Day Post-Op Procedure(s) (LRB): OPEN REDUCTION INTERNAL FIXATION (ORIF) ULNAR/RADIUS FRACTURE (Right) Active Problems:   Right forearm fracture  Estimated body mass index is 29.69 kg/(m^2) as calculated from the following:   Height as of this  encounter: 6\' 1"  (1.854 m).   Weight as of this encounter: 102.059 kg (225 lb). Advance diet Plan for discharge tomorrow Discharge home with home health  Dressing was taken down and rewrapped. Polar care re applied .  Labs: none DVT Prophylaxis - None Weight-Bearing as tolerated   D/C O2 and Pulse OX and try on Room Air IV tylenol added with tramadol  [facility] PA [facility] 01/11/2016, 8:02 AM

## 2016-01-11 NOTE — Progress Notes (Addendum)
Pt. In severe pain after receiving morphine 1mg  and 1 percocet. Dr. Rosita KeaMenz notified and ordered morphine 1-2mg  q2h prn

## 2016-01-11 NOTE — Anesthesia Postprocedure Evaluation (Signed)
Anesthesia Post Note  Patient: Idell PicklesRondeese Kenyon  Procedure(s) Performed: Procedure(s) (LRB): OPEN REDUCTION INTERNAL FIXATION (ORIF) ULNAR/RADIUS FRACTURE (Right)  Patient location during evaluation: PACU Anesthesia Type: General Level of consciousness: awake and alert Pain management: pain level controlled Vital Signs Assessment: post-procedure vital signs reviewed and stable Respiratory status: spontaneous breathing, nonlabored ventilation, respiratory function stable and patient connected to nasal cannula oxygen Cardiovascular status: blood pressure returned to baseline and stable Postop Assessment: no signs of nausea or vomiting Anesthetic complications: no    Last Vitals:  Filed Vitals:   01/11/16 0014 01/11/16 0113  BP: 141/80 146/84  Pulse: 93 82  Temp: 37.1 C 36.8 C  Resp:  18    Last Pain:  Filed Vitals:   01/11/16 0122  PainSc: Asleep                 Cleda MccreedyJoseph K Kassadee Carawan

## 2016-01-12 DIAGNOSIS — S52301A Unspecified fracture of shaft of right radius, initial encounter for closed fracture: Secondary | ICD-10-CM | POA: Diagnosis not present

## 2016-01-12 MED ORDER — TRAMADOL HCL 50 MG PO TABS: 50.0000 mg | ORAL_TABLET | ORAL | Status: DC | PRN

## 2016-01-12 MED ORDER — OXYCODONE HCL 5 MG PO TABS: 5.0000 mg | ORAL_TABLET | ORAL | Status: DC | PRN
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
## 2016-01-12 NOTE — Progress Notes (Signed)
  January 12, 2016  Patient: Kevin Decker  Date of Birth: [birth year]  Date of Visit: 01/09/2016    To Whom It May Concern:  Kevin Decker was seen and treated in our hospital from  01/09/2016 to 01/12/2016. Kevin Decker may return to work within 6-8 weeks.  Sincerely,   Marian SorrowJon Wolff, PA

## 2016-01-12 NOTE — Progress Notes (Signed)
Patient requesting return to work note. Per Marian SorrowJon Wolff, patient may return to work within 6-8 weeks. Work note given to patient.

## 2016-01-12 NOTE — Progress Notes (Addendum)
   Subjective: 2 Days Post-Op Procedure(s) (LRB): OPEN REDUCTION INTERNAL FIXATION (ORIF) ULNAR/RADIUS FRACTURE (Right) Patient reports pain as 7 on 0-10 scale.   Patient is well, and has had no acute complaints or problems Plan is to go Home after hospital stay. no nausea and no vomiting Patient denies any chest pains or shortness of breath. Objective: Vital signs in last 24 hours: Temp:  [98.6 F (37 C)-100 F (37.8 C)] 98.6 F (37 C) (03/31 0446) Pulse Rate:  [70-87] 70 (03/31 0446) Resp:  [18] 18 (03/31 0446) BP: (118-151)/(75-91) 136/91 mmHg (03/31 0446) SpO2:  [97 %-100 %] 99 % (03/31 0446) Poor effort with following comands  Appears to be able to move fingers well but hesitate to make a fist when ask too but can squeeze my finger well. Stays covered up with pillows. Skin warm and dry NV/NS appears to be wnl to right upper ext Heels are non tender and elevated off the bed using rolled towels Does not appear to be in a lot of pain even though he says he is. Intake/Output from previous day: 03/30 0701 - 03/31 0700 In: 2178.8 [I.V.:1778.8; IV Piggyback:400] Out: 2875 [Urine:2875] Intake/Output this shift: Total I/O In: 2178.8 [I.V.:1778.8; IV Piggyback:400] Out: 1100 [Urine:1100]  No results for input(s): HGB in the last 72 hours. No results for input(s): WBC, RBC, HCT, PLT in the last 72 hours. No results for input(s): NA, K, CL, CO2, BUN, CREATININE, GLUCOSE, CALCIUM in the last 72 hours. No results for input(s): LABPT, INR in the last 72 hours.  EXAM General - Patient is Alert, Appropriate and Oriented Extremity - Neurologically intact Neurovascular intact Sensation intact distally Intact pulses distally Dressing - dressing C/D/I Motor Function - intact, moving foot and toes well on exam.    Past Medical History  Diagnosis Date  . Pulmonary embolism (HCC)     Assessment/Plan: 2 Days Post-Op Procedure(s) (LRB): OPEN REDUCTION INTERNAL FIXATION (ORIF)  ULNAR/RADIUS FRACTURE (Right) Active Problems:   Right forearm fracture  Estimated body mass index is 29.69 kg/(m^2) as calculated from the following:   Height as of this encounter: 6\' 1"  (1.854 m).   Weight as of this encounter: 102.059 kg (225 lb). Discharge home with home health  Labs: none Please fit pt with a sling Discharge to home today [facility] Continue polar care around the clock Keep right arm elevated D/C O2 and Pulse OX and try on Room Air  [facility] PA [facility] 01/12/2016, 6:46 AM   Addendum: Patient is comfortable this morning. Pain is well controlled. The patient denies any gross paresthesias.  Posterior splint is in place to the right upper extremity. The hand and digits are warm with good capillary refill. Good active range of motion of the digits. Sensory function is intact without any apparent deficit.  The patient is stable for discharge to home today.  Aneri Slagel P. Angie FavaHooten, Jr. M.D.

## 2016-01-12 NOTE — Discharge Instructions (Signed)
Keep right upper extremity elevated. Continue Polar Care around-the-clock. Patient will [facility]. Please call and make an appointment. Oxycodone 1-2 tablets every 4-6 hours when necessary alternating with tramadol 50 mg 1-2 tablets every 4-6 hours when necessary Do not exceed the prescriptions for pain medicine. This will not be refilled if he runs out prior to the duration. Tylenol for temperatures greater than 101.5 Call the clinic if any increased symptoms or complications. Patient may resume a regular diet at home. Over-the-counter laxative for any constipation

## 2016-01-12 NOTE — Progress Notes (Signed)
Patient discharging home. Instructions and prescriptions given to patient, verbalized understanding. Waiting on transportation. Dressing dry and intact. Sling in place.

## 2016-09-18 ENCOUNTER — Other Ambulatory Visit: Payer: Self-pay | Admitting: Orthopedic Surgery

## 2016-09-18 DIAGNOSIS — G8929 Other chronic pain: Secondary | ICD-10-CM

## 2016-09-18 DIAGNOSIS — M25562 Pain in left knee: Principal | ICD-10-CM

## 2016-09-30 ENCOUNTER — Ambulatory Visit
Admission: RE | Admit: 2016-09-30 | Discharge: 2016-09-30 | Disposition: A | Discharge status: Home / Self Care | Payer: Federal, State, Local not specified - PPO | Source: Ambulatory Visit | Attending: Orthopedic Surgery | Admitting: Orthopedic Surgery

## 2016-09-30 DIAGNOSIS — M7122 Synovial cyst of popliteal space [Baker], left knee: Secondary | ICD-10-CM | POA: Insufficient documentation

## 2016-09-30 DIAGNOSIS — M25462 Effusion, left knee: Secondary | ICD-10-CM | POA: Insufficient documentation

## 2016-09-30 DIAGNOSIS — R937 Abnormal findings on diagnostic imaging of other parts of musculoskeletal system: Secondary | ICD-10-CM | POA: Diagnosis not present

## 2016-09-30 DIAGNOSIS — M1712 Unilateral primary osteoarthritis, left knee: Secondary | ICD-10-CM | POA: Diagnosis not present

## 2016-09-30 DIAGNOSIS — G8929 Other chronic pain: Secondary | ICD-10-CM

## 2016-09-30 DIAGNOSIS — M25562 Pain in left knee: Secondary | ICD-10-CM | POA: Diagnosis present

## 2016-10-14 DIAGNOSIS — I2699 Other pulmonary embolism without acute cor pulmonale: Secondary | ICD-10-CM

## 2016-10-14 HISTORY — DX: Other pulmonary embolism without acute cor pulmonale: I26.99

## 2016-10-28 ENCOUNTER — Inpatient Hospital Stay: Admission: RE | Admit: 2016-10-28 | Payer: No Typology Code available for payment source | Source: Ambulatory Visit

## 2016-10-29 ENCOUNTER — Encounter
Admission: RE | Admit: 2016-10-29 | Discharge: 2016-10-29 | Disposition: A | Discharge status: Home / Self Care | Payer: No Typology Code available for payment source | Source: Ambulatory Visit | Attending: Orthopedic Surgery | Admitting: Orthopedic Surgery

## 2016-10-29 DIAGNOSIS — Z01818 Encounter for other preprocedural examination: Secondary | ICD-10-CM | POA: Insufficient documentation

## 2016-10-29 HISTORY — DX: Other specified health status: Z78.9

## 2016-10-29 NOTE — Patient Instructions (Signed)
  Your procedure is scheduled on: 11/04/16 Mon Report to Same Day Surgery 2nd floor medical mall [facility](Medical Mall Entrance-take elevator on left to 2nd floor.  Check in with surgery information desk.) To find out your arrival time please call [phone] between 1PM - 3PM on 11/01/16 Fri  Remember: Instructions that are not followed completely may result in serious medical risk, up to and including death, or upon the discretion of your surgeon and anesthesiologist your surgery may need to be rescheduled.    _x___ 1. Do not eat food or drink liquids after midnight. No gum chewing or hard candies.     __x__ 2. No Alcohol for 24 hours before or after surgery.   __x__3. No Smoking for 24 prior to surgery.   ____  4. Bring all medications with you on the day of surgery if instructed.    __x__ 5. Notify your doctor if there is any change in your medical condition     (cold, fever, infections).     Do not wear jewelry, make-up, hairpins, clips or nail polish.  Do not wear lotions, powders, or perfumes. You may wear deodorant.  Do not shave 48 hours prior to surgery. Men may shave face and neck.  Do not bring valuables to the hospital.    [facility] is not responsible for any belongings or valuables.               Contacts, dentures or bridgework may not be worn into surgery.  Leave your suitcase in the car. After surgery it may be brought to your room.  For patients admitted to the hospital, discharge time is determined by your treatment team.   Patients discharged the day of surgery will not be allowed to drive home.  You will need someone to drive you home and stay with you the night of your procedure.    Please read over the following fact sheets that you were given:   [facility] Preparing for Surgery and or MRSA Information   _x___ Take these medicines the morning of surgery with A SIP OF WATER:    1. None  2.  3.  4.  5.  6.  ____Fleets enema or Magnesium Citrate as  directed.   _x___ Use CHG Soap or sage wipes as directed on instruction sheet   ____ Use inhalers on the day of surgery and bring to hospital day of surgery  ____ Stop metformin 2 days prior to surgery    ____ Take 1/2 of usual insulin dose the night before surgery and none on the morning of           surgery.   ____ Stop Aspirin, Coumadin, Pllavix ,Eliquis, Effient, or Pradaxa  x__ Stop Anti-inflammatories such as Advil, Aleve, Ibuprofen, Motrin, Naproxen,          Naprosyn, Goodies powders or aspirin products. Ok to take Tylenol.   ____ Stop supplements until after surgery.    ____ Bring C-Pap to the hospital.

## 2016-11-04 ENCOUNTER — Encounter
Admission: RE | Disposition: A | Discharge status: Home / Self Care | Payer: Self-pay | Source: Ambulatory Visit | Attending: Orthopedic Surgery

## 2016-11-04 ENCOUNTER — Ambulatory Visit
Admission: RE | Admit: 2016-11-04 | Discharge: 2016-11-04 | Disposition: A | Discharge status: Home / Self Care | Payer: Non-veteran care | Source: Ambulatory Visit | Attending: Orthopedic Surgery | Admitting: Orthopedic Surgery

## 2016-11-04 ENCOUNTER — Ambulatory Visit: Payer: Non-veteran care | Admitting: Anesthesiology

## 2016-11-04 DIAGNOSIS — M23212 Derangement of anterior horn of medial meniscus due to old tear or injury, left knee: Secondary | ICD-10-CM | POA: Insufficient documentation

## 2016-11-04 DIAGNOSIS — M25562 Pain in left knee: Secondary | ICD-10-CM | POA: Diagnosis present

## 2016-11-04 DIAGNOSIS — M23242 Derangement of anterior horn of lateral meniscus due to old tear or injury, left knee: Secondary | ICD-10-CM | POA: Insufficient documentation

## 2016-11-04 DIAGNOSIS — Z86718 Personal history of other venous thrombosis and embolism: Secondary | ICD-10-CM | POA: Insufficient documentation

## 2016-11-04 DIAGNOSIS — Z86711 Personal history of pulmonary embolism: Secondary | ICD-10-CM | POA: Diagnosis not present

## 2016-11-04 HISTORY — PX: KNEE ARTHROSCOPY: SHX127

## 2016-11-04 SURGERY — ARTHROSCOPY, KNEE
Anesthesia: General | Laterality: Left | Wound class: Clean

## 2016-11-04 MED ORDER — BUPIVACAINE-EPINEPHRINE (PF) 0.25% -1:200000 IJ SOLN
INTRAMUSCULAR | Status: AC
  Filled 2016-11-04: qty 30

## 2016-11-04 MED ORDER — HYDROCODONE-ACETAMINOPHEN 5-325 MG PO TABS: 1.0000 | ORAL_TABLET | ORAL | 0 refills | Status: DC | PRN

## 2016-11-04 MED ORDER — FENTANYL CITRATE (PF) 100 MCG/2ML IJ SOLN: 25.0000 ug | INTRAMUSCULAR | Status: DC | PRN

## 2016-11-04 MED ORDER — MORPHINE SULFATE (PF) 4 MG/ML IV SOLN
INTRAVENOUS | Status: AC
  Filled 2016-11-04: qty 1

## 2016-11-04 MED ORDER — FENTANYL CITRATE (PF) 100 MCG/2ML IJ SOLN
INTRAMUSCULAR | Status: DC | PRN
  Administered 2016-11-04 (×3): 50 ug via INTRAVENOUS

## 2016-11-04 MED ORDER — FAMOTIDINE 20 MG PO TABS
ORAL_TABLET | ORAL | Status: AC
  Administered 2016-11-04: 20 mg via ORAL
  Filled 2016-11-04: qty 1

## 2016-11-04 MED ORDER — DEXAMETHASONE SODIUM PHOSPHATE 10 MG/ML IJ SOLN
INTRAMUSCULAR | Status: AC
  Filled 2016-11-04: qty 1

## 2016-11-04 MED ORDER — PROMETHAZINE HCL 25 MG/ML IJ SOLN: 6.2500 mg | INTRAMUSCULAR | Status: DC | PRN

## 2016-11-04 MED ORDER — PROPOFOL 10 MG/ML IV BOLUS
INTRAVENOUS | Status: AC
  Filled 2016-11-04: qty 20

## 2016-11-04 MED ORDER — ONDANSETRON HCL 4 MG/2ML IJ SOLN
INTRAMUSCULAR | Status: DC | PRN
  Administered 2016-11-04: 4 mg via INTRAVENOUS

## 2016-11-04 MED ORDER — MEPERIDINE HCL 25 MG/ML IJ SOLN: 6.2500 mg | INTRAMUSCULAR | Status: DC | PRN

## 2016-11-04 MED ORDER — DEXAMETHASONE SODIUM PHOSPHATE 10 MG/ML IJ SOLN
INTRAMUSCULAR | Status: DC | PRN
  Administered 2016-11-04: 10 mg via INTRAVENOUS

## 2016-11-04 MED ORDER — BUPIVACAINE-EPINEPHRINE (PF) 0.25% -1:200000 IJ SOLN
INTRAMUSCULAR | Status: DC | PRN
  Administered 2016-11-04: 30 mL

## 2016-11-04 MED ORDER — CHLORHEXIDINE GLUCONATE 4 % EX LIQD
60.0000 mL | Freq: Once | CUTANEOUS | Status: AC
  Administered 2016-11-04: 4 via TOPICAL

## 2016-11-04 MED ORDER — ONDANSETRON HCL 4 MG/2ML IJ SOLN
INTRAMUSCULAR | Status: AC
  Filled 2016-11-04: qty 2

## 2016-11-04 MED ORDER — FAMOTIDINE 20 MG PO TABS
20.0000 mg | ORAL_TABLET | Freq: Once | ORAL | Status: AC
  Administered 2016-11-04: 20 mg via ORAL

## 2016-11-04 MED ORDER — KETOROLAC TROMETHAMINE 30 MG/ML IJ SOLN
INTRAMUSCULAR | Status: DC | PRN
  Administered 2016-11-04: 30 mg via INTRAVENOUS

## 2016-11-04 MED ORDER — LIDOCAINE HCL (PF) 2 % IJ SOLN
INTRAMUSCULAR | Status: AC
  Filled 2016-11-04: qty 2

## 2016-11-04 MED ORDER — MIDAZOLAM HCL 2 MG/2ML IJ SOLN
INTRAMUSCULAR | Status: DC | PRN
  Administered 2016-11-04: 2 mg via INTRAVENOUS

## 2016-11-04 MED ORDER — OXYCODONE HCL 5 MG PO TABS: 5.0000 mg | ORAL_TABLET | Freq: Once | ORAL | Status: DC | PRN

## 2016-11-04 MED ORDER — OXYCODONE HCL 5 MG/5ML PO SOLN: 5.0000 mg | Freq: Once | ORAL | Status: DC | PRN

## 2016-11-04 MED ORDER — FENTANYL CITRATE (PF) 100 MCG/2ML IJ SOLN
INTRAMUSCULAR | Status: AC
Start: 2016-11-04 — End: 2016-11-04
  Filled 2016-11-04: qty 2

## 2016-11-04 MED ORDER — ACETAMINOPHEN 10 MG/ML IV SOLN
INTRAVENOUS | Status: AC
  Filled 2016-11-04: qty 100

## 2016-11-04 MED ORDER — FENTANYL CITRATE (PF) 100 MCG/2ML IJ SOLN
INTRAMUSCULAR | Status: AC
  Filled 2016-11-04: qty 2

## 2016-11-04 MED ORDER — MORPHINE SULFATE (PF) 4 MG/ML IV SOLN
INTRAVENOUS | Status: DC | PRN
  Administered 2016-11-04: 4 mg via INTRAVENOUS

## 2016-11-04 MED ORDER — LIDOCAINE HCL (CARDIAC) 20 MG/ML IV SOLN
INTRAVENOUS | Status: DC | PRN
  Administered 2016-11-04: 50 mg via INTRAVENOUS

## 2016-11-04 MED ORDER — PROPOFOL 10 MG/ML IV BOLUS
INTRAVENOUS | Status: DC | PRN
  Administered 2016-11-04: 200 mg via INTRAVENOUS

## 2016-11-04 MED ORDER — LACTATED RINGERS IV SOLN
INTRAVENOUS | Status: DC
  Administered 2016-11-04: 14:00:00 via INTRAVENOUS

## 2016-11-04 MED ORDER — MIDAZOLAM HCL 2 MG/2ML IJ SOLN
INTRAMUSCULAR | Status: AC
  Filled 2016-11-04: qty 2

## 2016-11-04 MED ORDER — ACETAMINOPHEN 10 MG/ML IV SOLN
INTRAVENOUS | Status: DC | PRN
  Administered 2016-11-04: 1000 mg via INTRAVENOUS

## 2016-11-04 SURGICAL SUPPLY — 23 items
BLADE SHAVER 4.5 DBL SERAT CV (CUTTER) ×3 IMPLANT
BNDG ESMARK 6X12 TAN STRL LF (GAUZE/BANDAGES/DRESSINGS) IMPLANT
CUFF TOURN 24 STER (MISCELLANEOUS) ×3 IMPLANT
CUFF TOURN 30 STER DUAL PORT (MISCELLANEOUS) IMPLANT
DRSG DERMACEA 8X12 NADH (GAUZE/BANDAGES/DRESSINGS) ×3 IMPLANT
DURAPREP 26ML APPLICATOR (WOUND CARE) ×6 IMPLANT
GAUZE SPONGE 4X4 12PLY STRL (GAUZE/BANDAGES/DRESSINGS) ×3 IMPLANT
GLOVE BIOGEL M STRL SZ7.5 (GLOVE) ×3 IMPLANT
GLOVE INDICATOR 8.0 STRL GRN (GLOVE) ×3 IMPLANT
GOWN STRL REUS W/ TWL LRG LVL3 (GOWN DISPOSABLE) ×2 IMPLANT
GOWN STRL REUS W/TWL LRG LVL3 (GOWN DISPOSABLE) ×4
IV LACTATED RINGER IRRG 3000ML (IV SOLUTION) ×12
IV LR IRRIG 3000ML ARTHROMATIC (IV SOLUTION) ×6 IMPLANT
KIT RM TURNOVER STRD PROC AR (KITS) ×3 IMPLANT
MANIFOLD NEPTUNE II (INSTRUMENTS) ×3 IMPLANT
PACK ARTHROSCOPY KNEE (MISCELLANEOUS) ×3 IMPLANT
SET TUBE SUCT SHAVER OUTFL 24K (TUBING) ×3 IMPLANT
SET TUBE TIP INTRA-ARTICULAR (MISCELLANEOUS) ×3 IMPLANT
SUT ETHILON 3-0 FS-10 30 BLK (SUTURE) ×3
SUTURE EHLN 3-0 FS-10 30 BLK (SUTURE) ×1 IMPLANT
TUBING ARTHRO INFLOW-ONLY STRL (TUBING) ×3 IMPLANT
WAND HAND CNTRL MULTIVAC 50 (MISCELLANEOUS) ×3 IMPLANT
WRAP KNEE W/COLD PACKS 25.5X14 (SOFTGOODS) ×3 IMPLANT

## 2016-11-04 NOTE — Anesthesia Preprocedure Evaluation (Signed)
Anesthesia Evaluation  Patient identified by MRN, date of birth, ID band Patient awake    Reviewed: Allergy & Precautions, NPO status , Patient's Chart, lab work & pertinent test results  History of Anesthesia Complications Negative for: history of anesthetic complications  Airway Mallampati: III  TM Distance: >3 FB Neck ROM: Full    Dental no notable dental hx.    Pulmonary neg pulmonary ROS, neg sleep apnea, neg COPD,    breath sounds clear to auscultation- rhonchi (-) wheezing      Cardiovascular Exercise Tolerance: Good (-) hypertension(-) CAD and (-) Past MI  Rhythm:Regular Rate:Normal - Systolic murmurs and - Diastolic murmurs    Neuro/Psych negative neurological ROS  negative psych ROS   GI/Hepatic negative GI ROS, Neg liver ROS,   Endo/Other  negative endocrine ROSneg diabetes  Renal/GU negative Renal ROS     Musculoskeletal negative musculoskeletal ROS (+)   Abdominal (+) - obese,   Peds  Hematology negative hematology ROS (+)   Anesthesia Other Findings   Reproductive/Obstetrics                             Anesthesia Physical Anesthesia Plan  ASA: I  Anesthesia Plan: General   Post-op Pain Management:    Induction: Intravenous  Airway Management Planned: LMA  Additional Equipment:   Intra-op Plan:   Post-operative Plan:   Informed Consent: I have reviewed the patients History and Physical, chart, labs and discussed the procedure including the risks, benefits and alternatives for the proposed anesthesia with the patient or authorized representative who has indicated his/her understanding and acceptance.   Dental advisory given  Plan Discussed with: CRNA and Anesthesiologist  Anesthesia Plan Comments:         Anesthesia Quick Evaluation

## 2016-11-04 NOTE — Anesthesia Postprocedure Evaluation (Signed)
Anesthesia Post Note  Patient: Kevin Decker  Procedure(s) Performed: Procedure(s) (LRB): ARTHROSCOPY KNEE, Partial MEdial and Lateral Meniscectomy (Left)  Patient location during evaluation: PACU Anesthesia Type: General Level of consciousness: awake and alert Pain management: pain level controlled Vital Signs Assessment: post-procedure vital signs reviewed and stable Respiratory status: spontaneous breathing, nonlabored ventilation, respiratory function stable and patient connected to nasal cannula oxygen Cardiovascular status: blood pressure returned to baseline and stable Postop Assessment: no signs of nausea or vomiting Anesthetic complications: no     Last Vitals:  Vitals:   11/04/16 1711 11/04/16 1751  BP: 120/76 130/75  Pulse: 74 75  Resp: 16 16  Temp: 36.3 C     Last Pain:  Vitals:   11/04/16 1711  TempSrc: Temporal  PainSc:                  Cleda MccreedyJoseph K Rosely Fernandez

## 2016-11-04 NOTE — Anesthesia Post-op Follow-up Note (Cosign Needed)
Anesthesia QCDR form completed.        

## 2016-11-04 NOTE — H&P (Signed)
The patient has been re-examined, and the chart reviewed, and there have been no interval changes to the documented history and physical.    The risks, benefits, and alternatives have been discussed at length. The patient expressed understanding of the risks benefits and agreed with plans for surgical intervention.  Auguste Tebbetts P. Samiel Peel, Jr. M.D.    

## 2016-11-04 NOTE — Transfer of Care (Signed)
Immediate Anesthesia Transfer of Care Note  Patient: Kevin Decker  Procedure(s) Performed: Procedure(s): ARTHROSCOPY KNEE, Partial MEdial and Lateral Meniscectomy (Left)  Patient Location: PACU  Anesthesia Type:General  Level of Consciousness: awake  Airway & Oxygen Therapy: Patient Spontanous Breathing and Patient connected to face mask oxygen  Post-op Assessment: Report given to RN and Post -op Vital signs reviewed and stable  Post vital signs: Reviewed and stable  Last Vitals:  Vitals:   11/04/16 1342 11/04/16 1615  BP: (!) 142/99 (!) 98/52  Pulse: 82 73  Resp: 16 18  Temp: 36.7 C 36.4 C    Last Pain:  Vitals:   11/04/16 1615  TempSrc:   PainSc: Asleep         Complications: No apparent anesthesia complications

## 2016-11-04 NOTE — Brief Op Note (Signed)
11/04/2016  4:15 PM  PATIENT:  Kevin Decker  36 y.o. male  PRE-OPERATIVE DIAGNOSIS:  INTERNAL DERANGEMENT OF LEFT KNEE  POST-OPERATIVE DIAGNOSIS:  INTERNAL DERANGEMENT OF LEFT KNEE, tear anterior medial and posterior lateral meniscus  PROCEDURE:  Procedure(s): ARTHROSCOPY KNEE, Partial MEdial and Lateral Meniscectomy (Left)  SURGEON:  Surgeon(s) and Role:    * Donato HeinzJames P Hooten, MD - Primary  ASSISTANTS: none   ANESTHESIA:   general  EBL:  Total I/O In: 1200 [I.V.:1200] Out: 0   BLOOD ADMINISTERED:none  DRAINS: none   LOCAL MEDICATIONS USED:  MARCAINE     SPECIMEN:  No Specimen  DISPOSITION OF SPECIMEN:  N/A  COUNTS:  YES  TOURNIQUET:   not used  DICTATION: .Office managerDragon Dictation  PLAN OF CARE: Discharge to home after PACU  PATIENT DISPOSITION:  PACU - hemodynamically stable.   Delay start of Pharmacological VTE agent (>24hrs) due to surgical blood loss or risk of bleeding: not applicable

## 2016-11-04 NOTE — Anesthesia Procedure Notes (Signed)
Procedure Name: LMA Insertion Date/Time: 11/04/2016 2:45 PM Performed by: Ginger CarneMICHELET, George Alcantar Pre-anesthesia Checklist: Patient identified, Emergency Drugs available, Suction available, Patient being monitored and Timeout performed Patient Re-evaluated:Patient Re-evaluated prior to inductionOxygen Delivery Method: Circle system utilized Preoxygenation: Pre-oxygenation with 100% oxygen Intubation Type: IV induction LMA: LMA inserted LMA Size: 5.0 Tube type: Oral Number of attempts: 1 Placement Confirmation: positive ETCO2 and breath sounds checked- equal and bilateral Tube secured with: Tape Dental Injury: Teeth and Oropharynx as per pre-operative assessment

## 2016-11-04 NOTE — Op Note (Signed)
OPERATIVE NOTE  DATE OF SURGERY:  11/04/2016  PATIENT NAME:  Kevin Decker Nied   DOB: 1980-11-16  MRN: [id number]   PRE-OPERATIVE DIAGNOSIS:  Internal derangement of the left knee   POST-OPERATIVE DIAGNOSIS:   Tear of the anterior horn of the medial meniscus, left knee Tear of the posterior horn of the lateral meniscus, left knee  PROCEDURE:  Left knee arthroscopy, partial medial and lateral meniscectomies  SURGEON:  Jena GaussJames P Yacoub Diltz, Jr., M.D.   ASSISTANT: none  ANESTHESIA: general  ESTIMATED BLOOD LOSS: Minimal  FLUIDS REPLACED: 1200 mL of crystalloid  TOURNIQUET TIME: Not used   DRAINS: none  IMPLANTS UTILIZED: None  INDICATIONS FOR SURGERY: Kevin Decker Ransford is a 36 y.o. year old male who has been seen for complaints of left knee pain. MRI demonstrated findings consistent with meniscal pathology. After discussion of the risks and benefits of surgical intervention, the patient expressed understanding of the risks benefits and agree with plans for left knee arthroscopy.   PROCEDURE IN DETAIL: The patient was brought into the operating room and, after adequate general anesthesia was achieved, a tourniquet was applied to the left thigh and the leg was placed in the leg holder. All bony prominences were well padded. The patient's left knee was cleaned and prepped with alcohol and Duraprep and draped in the usual sterile fashion. A "timeout" was performed as per usual protocol. The anticipated portal sites were injected with 0.25% Marcaine with epinephrine. An anterolateral incision was made and a cannula was inserted. A small effusion was evacuated and the knee was distended with fluid using the pump. The scope was advanced down the medial gutter into the medial compartment. Under visualization with the scope, an anteromedial portal was created and a hooked probe was inserted. The medial meniscus was visualized and probed. The posterior horn was in excellent condition. However, there was  a small degenerative tear to the anterior horn of the medial meniscus. The tear was debrided using a 4.5 mm incisor shaver and then contoured using the 50 ArthroCare wand. The articular cartilage was visualized and was noted to be in good condition.  The scope was then advanced into the intercondylar notch. The anterior cruciate ligament was visualized and probed and felt to be intact. The scope was removed from the lateral portal and reinserted via the anteromedial portal to better visualize the lateral compartment. The lateral meniscus was visualized and probed. A segment of the posterior horn was absent and the popliteus tendon was easily visualized. The tears were noted to the section of the lateral meniscus to either side of the popliteus. The tears were debrided using meniscal punches and a 4.5 mm incisor shaver. Final contouring was performed using the 50 ArthroCare wand. The anterior horn was visualized and noted to have only mild degenerative changes. These areas were debrided using ArthroCare wand. The articular cartilage of the lateral compartment was visualized and was noted to be in good condition. Finally, the scope was advanced so as to visualize the patellofemoral articulation. Good patellar tracking was appreciated. The articular surface was in good condition.  The knee was irrigated with copius amounts of fluid and suctioned dry. The anterolateral portal was re-approximated with #3-0 nylon. A combination of 0.25% Marcaine with epinephrine and 4 mg of Morphine were injected via the scope. The scope was removed and the anteromedial portal was re-approximated with #3-0 nylon. A sterile dressing was applied followed by application of an ice wrap.  The patient tolerated the procedure well and was transported  to the PACU in stable condition.  Aijalon Kirtz P. Holley Bouche., M.D.

## 2016-11-04 NOTE — Discharge Instructions (Signed)
°  Instructions after Knee Arthroscopy  ° °- James P. Hooten, Jr., M.D.    ° Dept. of Orthopaedics & Sports Medicine ° [facility] ° [address] ° , [place]  [address] ° ° Phone: [phone]   Fax: [phone] ° ° °DIET: °• Drink plenty of non-alcoholic fluids & begin a light diet. °• Resume your normal diet the day after surgery. ° °ACTIVITY:  °• You may use crutches or a walker with weight-bearing as tolerated, unless instructed otherwise. °• You may wean yourself off of the walker or crutches as tolerated.  °• Begin doing gentle exercises. Exercising will reduce the pain and swelling, increase motion, and prevent muscle weakness.   °• Avoid strenuous activities or athletics for a minimum of 4-6 weeks after arthroscopic surgery. °• Do not drive or operate any equipment until instructed. ° °WOUND CARE:  °• Place one to two pillows under the knee the first day or two when sitting or lying.  °• Continue to use the ice packs periodically to reduce pain and swelling. °• The small incisions in your knee are closed with nylon stitches. The stitches will be removed in the office. °• The bulky dressing may be removed on the second day after surgery. DO NOT TOUCH THE STITCHES. Put a Band-Aid over each stitch. Do NOT use any ointments or creams on the incisions.  °• You may bathe or shower after the stitches are removed at the first office visit following surgery. ° °MEDICATIONS: °• You may resume your regular medications. °• Please take the pain medication as prescribed. °• Do not take pain medication on an empty stomach. °• Do not drive or drink alcoholic beverages when taking pain medications. ° °CALL THE OFFICE FOR: °• Temperature above 101 degrees °• Excessive bleeding or drainage on the dressing. °• Excessive swelling, coldness, or paleness of the toes. °• Persistent nausea and vomiting. ° °FOLLOW-UP:  °• You should have an appointment to return to the office in 7-10 days after surgery.   ° ° °AMBULATORY SURGERY  °DISCHARGE INSTRUCTIONS ° ° °1) The drugs that you were given will stay in your system until tomorrow so for the next 24 hours you should not: ° °A) Drive an automobile °B) Make any legal decisions °C) Drink any alcoholic beverage ° ° °2) You may resume regular meals tomorrow.  Today it is better to start with liquids and gradually work up to solid foods. ° °You may eat anything you prefer, but it is better to start with liquids, then soup and crackers, and gradually work up to solid foods. ° ° °3) Please notify your doctor immediately if you have any unusual bleeding, trouble breathing, redness and pain at the surgery site, drainage, fever, or pain not relieved by medication. ° ° ° °4) Additional Instructions: ° ° ° ° ° ° ° °Please contact your physician with any problems or Same Day Surgery at [phone], Monday through Friday 6 am to 4 pm, or [place] [facility] number at [phone]. ° °

## 2016-11-05 ENCOUNTER — Encounter: Payer: Self-pay | Admitting: Orthopedic Surgery
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
## 2019-08-05 ENCOUNTER — Encounter: Payer: No Typology Code available for payment source | Admitting: Physical Medicine & Rehabilitation

## 2019-08-31 ENCOUNTER — Other Ambulatory Visit: Payer: Self-pay

## 2019-08-31 ENCOUNTER — Encounter
Payer: No Typology Code available for payment source | Attending: Physical Medicine & Rehabilitation | Admitting: Physical Medicine & Rehabilitation

## 2019-08-31 ENCOUNTER — Encounter: Payer: Self-pay | Admitting: Physical Medicine & Rehabilitation

## 2019-08-31 VITALS — BP 125/82 | HR 78 | Temp 97.7°F | Ht 73.0 in | Wt 208.0 lb

## 2019-08-31 DIAGNOSIS — M533 Sacrococcygeal disorders, not elsewhere classified: Secondary | ICD-10-CM

## 2019-08-31 DIAGNOSIS — M5136 Other intervertebral disc degeneration, lumbar region: Secondary | ICD-10-CM | POA: Diagnosis present

## 2019-08-31 NOTE — Progress Notes (Signed)
Subjective:    Patient ID: Kevin Decker, male    DOB: [birth year], 38 y.o.   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  He states that his initial medical issues were related to meniscal tears in the knee but these improved after 2 arthroscopic partial meniscectomies.  He no longer has significant knee pain. The patient feels like his back pain has not been addressed to the degree that his knee pain has been.  He does not want to pursue surgery for his back but likes to see if there is any other treatment modalities and also have a clear picture of his diagnosis. He has tried chiropractic treatment which give some short-term relief.  He is fond of the TENS unit which she has at home as well but tries not to overuse it. In addition he has downloaded a core strengthening application on his phone and has tried some of the exercises on this.  Extension exercises, i.e. Cobra or up dog type exercise feel better than flexion exercises.  He has 1 particular spot that bothers him the most on the right side of the low back and buttock right at the belt line.  The patient wishes to avoid oral medications.  He does use diclofenac gel quite liberally in several areas. His goal is to play with his 60-year-old son. 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    Right sided low cervical midline pain.  Pain Inventory Average Pain 10 Pain Right Now 9 My pain is sharp  In the last 24 hours, has pain interfered with the following? General activity 8 Relation with others 8 Enjoyment of life 10 What TIME of day is your pain at its worst? all Sleep (in general) Fair  Pain is worse  with: unsure Pain improves with: na Relief from Meds: na  Mobility walk without assistance ability to climb steps?  yes do you drive?  yes  Function employed # of hrs/week 40 what is your job? [employer]  Neuro/Psych No problems in this area  Prior Studies Any changes since last visit?  no CLINICAL DATA:  Left knee pain laterally. Symptoms worsening over the past year.  EXAM: MRI OF THE LEFT KNEE WITHOUT CONTRAST  TECHNIQUE: Multiplanar, multisequence MR imaging of the knee was performed. No intravenous contrast was administered.  COMPARISON:  None.  FINDINGS: Despite efforts by the technologist and patient, motion artifact is present on today's exam and could not be eliminated. This reduces exam sensitivity and specificity.    Medial meniscus:  Unremarkable  Lateral meniscus: Absent visualization of a significant segment of the lateral meniscus extending from the midbody to a include much of the posterior horn. An irregular root of the posterior horn lateral meniscus is observed.  LIGAMENTS  Cruciates:  Unremarkable  Collaterals:  Unremarkable  CARTILAGE  Patellofemoral:  Unremarkable  Medial:  Unremarkable  Lateral: Moderate chondral thinning along the lateral tibial plateau, with a small degenerative focus of subcortical edema on image 14/6. Marginal spurring.  Joint:  Small knee effusion.  Popliteal Fossa:  Small Baker's cyst.  Extensor Mechanism:  Unremarkable  Bones: No  significant extra-articular osseous abnormalities identified.  Other: No supplemental non-categorized findings.  IMPRESSION: 1. Absent visualization of a significant segment of the posterior horn and midbody lateral meniscus with irregular remaining tissue in the posterior horn. Much of this may be from partial meniscectomy and markedly severe meniscal degeneration but certainly a re-tear is not excluded. 2. Accelerated degeneration in the lateral  compartment with moderate chondral thinning along the lateral tibial plateau. 3. Small knee effusion and small Baker's cyst.   Electronically Signed   By: Gaylyn RongWalter  Liebkemann M.D.   On: 09/30/2016 09:59 Physicians involved in your care Any changes since last visit?  no   No family history on file. Social History   Socioeconomic History  . Marital status: Single    Spouse name: Not on file  . Number of children: Not on file  . Years of education: Not on file  . Highest education level: Not on file  Occupational History  . Not on file  Social Needs  . Financial resource strain: Not on file  . Food insecurity    Worry: Not on file    Inability: Not on file  . Transportation needs    Medical: Not on file    Non-medical: Not on file  Tobacco Use  . Smoking status: Never Smoker  . Smokeless tobacco: Never Used  Substance and Sexual Activity  . Alcohol use: Yes    Comment: social  . Drug use: No  . Sexual activity: Not on file  Lifestyle  . Physical activity    Days per week: Not on file    Minutes per session: Not on file  . Stress: Not on file  Relationships  . Social Musicianconnections    Talks on phone: Not on file    Gets together: Not on file    Attends religious service: Not on file    Active member of club or organization: Not on file    Attends meetings of clubs or organizations: Not on file    Relationship status: Not on file  Other Topics Concern  . Not on file  Social History Narrative  . Not on file   Past Surgical History:  Procedure Laterality Date  . APPENDECTOMY    . KNEE ARTHROSCOPY Left 11/04/2016   Procedure: ARTHROSCOPY KNEE, Partial MEdial and Lateral Meniscectomy;  Surgeon: Donato HeinzJames P Hooten, MD;  Location: [facility] ORS;  Service: Orthopedics;  Laterality: Left;  . ORIF ULNAR FRACTURE Right 01/10/2016   Procedure: OPEN REDUCTION INTERNAL FIXATION (ORIF) ULNAR/RADIUS FRACTURE;  Surgeon: Donato HeinzJames P Hooten, MD;  Location: [facility] ORS;  Service: Orthopedics;   Laterality: Right;  . torn meniscus Left   . WRIST FRACTURE SURGERY Right    Past Medical History:  Diagnosis Date  . Medical history non-contributory   . Pulmonary embolism (HCC)    BP 125/82   Pulse 78   Temp 97.7 F (36.5 C)   Ht 6\' 1"  (1.854 m)   Wt 208 lb (94.3 kg)   SpO2 97%   BMI 27.44 kg/m   Opioid Risk Score:   Fall Risk Score:  `1  Depression screen PHQ 2/9  No flowsheet data found.   Review of Systems  Constitutional: Negative.   HENT: Negative.   Eyes: Negative.   Respiratory: Negative.   Cardiovascular: Negative.   Gastrointestinal: Negative.   Endocrine: Negative.   Genitourinary: Negative.   Musculoskeletal: Positive for arthralgias, back pain and myalgias.  Skin: Negative.   Allergic/Immunologic: Negative.   Neurological: Negative.  Hematological: Negative.   Psychiatric/Behavioral: Negative.   All other systems reviewed and are negative.      Objective:   Physical Exam Constitutional:      General: He is not in acute distress.    Appearance: Normal appearance. He is normal weight.  HENT:     Head: Normocephalic and atraumatic.  Eyes:     General: No scleral icterus.    Extraocular Movements: Extraocular movements intact.     Conjunctiva/sclera: Conjunctivae normal.     Pupils: Pupils are equal, round, and reactive to light.  Neck:     Musculoskeletal: Normal range of motion and neck supple. No neck rigidity or muscular tenderness.  Cardiovascular:     Rate and Rhythm: Normal rate and regular rhythm.     Heart sounds: Normal heart sounds. No murmur.  Pulmonary:     Effort: Pulmonary effort is normal. No respiratory distress.     Breath sounds: Normal breath sounds. No stridor.  Abdominal:     General: Abdomen is flat. Bowel sounds are normal. There is no distension.     Palpations: Abdomen is soft. There is no mass.  Skin:    General: Skin is warm and dry.  Neurological:     Mental Status: He is alert and oriented to person,  place, and time.  Psychiatric:        Mood and Affect: Mood normal.        Behavior: Behavior normal.    Positive Faber's right side PSIS area.  No groin pain with the maneuver Negative straight leg raise bilaterally Sensation intact light touch as well as pinprick bilateral C5-C6-C7 C8 L2-L3-L4 L5-S1 dermatome distribution Motor strength is 5/5 bilateral deltoid, bicep, tricep, grip, hip flexor, knee extensor, ankle dorsiflexor and plantar flexor       Assessment & Plan:  #1.  Chronic low back pain without sciatica.  This is mainly right-sided.  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  He does have an extension preference in terms of exercise which may suggest some discogenic pain as well.  He has already done some core strengthening and I have given him some additional exercises.  I do think he would benefit from physical therapy a total of 15 visits. I would like to see him back in approximately 1 month's time to see how he is progressing.  If he does not make significant progress I would recommend sacroiliac injection under fluoroscopic guidance.

## 2019-08-31 NOTE — Patient Instructions (Signed)
Herniated Disk Rehab Ask your health care provider which exercises are safe for you. Do exercises exactly as told by your health care provider and adjust them as directed. It is normal to feel mild stretching, pulling, tightness, or discomfort as you do these exercises. Stop right away if you feel sudden pain or your pain gets worse. Do not begin these exercises until told by your health care provider. Stretching and range-of-motion exercises These exercises warm up your muscles and joints and improve the movement and flexibility of your back. These exercises also help to relieve pain, numbness, and tingling. Prone extension on elbows  1. Lie on your abdomen on a firm surface (prone position). 2. Prop yourself up on your elbows. 3. Use your arms to help lift your chest up until you feel a gentle stretch in your abdomen and your lower back. ? This will place some of your body weight on your elbows. If this is uncomfortable, try stacking pillows under your chest. ? Your hips should stay down, against the surface that you are lying on. Keep your hip and back muscles relaxed. 4. Hold this position for __________ seconds. 5. Slowly relax your upper body and return to the starting position. Repeat __________ times. Complete this exercise __________ times a day. Standing extension 1. Stand with your feet shoulder-width apart. 2. Place your hands on your lower back, with your palms on your back. 3. Gently arch your back (extension) and press forward with your hands. Keep your hips over your toes. Do not let your hips drift forward while arching your back. 4. Hold this position for __________ seconds. 5. Slowly return to the starting position. Repeat __________ times. Complete this exercise __________ times a day. Strengthening exercises These exercises build strength and endurance in your back. Endurance is the ability to use your muscles for a long time, even after they get tired. Pelvic tilt 1. Lie  on your back on a firm surface. Bend your knees and keep your feet flat. 2. Tense your abdominal muscles. Tip your pelvis up toward the ceiling and flatten your lower back into the floor. ? To help with this exercise, you may place a small towel under your lower back and try to push your back into the towel. 3. Hold this position for __________ seconds. 4. Let your muscles relax completely before you repeat this exercise. Repeat __________ times. Complete this exercise __________ times a day. Alternating arm and leg raises  1. Get on your hands and knees on a firm surface. If you are on a hard floor, you may want to use padding, such as an exercise mat, to cushion your knees. 2. Line up your arms and legs. Your hands should be below your shoulders, and your knees should be below your hips. 3. Lift your left leg behind you. At the same time, raise your right arm and straighten it in front of you. ? Do not lift your leg higher than your hip. ? Do not lift your arm higher than your shoulder. ? Keep your abdominal and back muscles tight. ? Keep your hips facing the ground. ? Do not arch your back. ? Keep your balance carefully, and do not hold your breath. 4. Hold this position for __________ seconds. 5. Slowly return to the starting position and repeat with your right leg and your left arm. Repeat __________ times. Complete this exercise __________ times a day. Bridge  1. Lie on your back on a firm surface with your knees bent  and your feet flat on the floor. 2. Tighten your abdominal and buttocks muscles and lift your bottom off the floor until your trunk and hips are level with your thighs. ? You should feel the muscles working in your buttocks and the back of your thighs. ? Do not arch your back. ? If this exercise is too easy, try doing it with your arms crossed over your chest. 3. Hold this position for __________ seconds. 4. Slowly lower your hips to the starting position. 5. Let your  muscles relax completely after each repetition. Repeat __________ times. Complete this exercise __________ times a day. This information is not intended to replace advice given to you by your health care provider. Make sure you discuss any questions you have with your health care provider. Document Released: 09/30/2005 Document Revised: 01/21/2019 Document Reviewed: 07/29/2018 [employer] Patient Education  2020 [employer]  

## 2019-09-02 ENCOUNTER — Ambulatory Visit
Payer: No Typology Code available for payment source | Attending: Physical Medicine & Rehabilitation | Admitting: Physical Therapy

## 2019-09-02 DIAGNOSIS — M545 Low back pain: Secondary | ICD-10-CM | POA: Insufficient documentation

## 2019-09-02 DIAGNOSIS — M62830 Muscle spasm of back: Secondary | ICD-10-CM | POA: Insufficient documentation

## 2019-09-02 DIAGNOSIS — G8929 Other chronic pain: Secondary | ICD-10-CM | POA: Insufficient documentation

## 2019-09-13 ENCOUNTER — Other Ambulatory Visit: Payer: Self-pay

## 2019-09-13 ENCOUNTER — Encounter: Payer: Self-pay | Admitting: Physical Therapy

## 2019-09-13 ENCOUNTER — Ambulatory Visit: Payer: No Typology Code available for payment source | Admitting: Physical Therapy

## 2019-09-13 DIAGNOSIS — G8929 Other chronic pain: Secondary | ICD-10-CM

## 2019-09-13 DIAGNOSIS — M545 Low back pain, unspecified: Secondary | ICD-10-CM

## 2019-09-13 DIAGNOSIS — M62830 Muscle spasm of back: Secondary | ICD-10-CM | POA: Diagnosis present

## 2019-09-13 NOTE — Therapy (Signed)
[facility] [address] [place], [place], [address] Phone: [phone]   Fax:  [phone]  Physical Therapy Evaluation  Patient Details  Name: Kevin PicklesRondeese Decker MRN: [id number] Date of Birth: 02-12-1981 Referring Provider (PT): Kirsteins   Encounter Date: 09/13/2019  PT End of Session - 09/13/19 [id number]    Visit Number  1    Date for PT Re-Evaluation  11/13/19    PT Start Time  0805    PT Stop Time  0852    PT Time Calculation (min)  47 min    Activity Tolerance  Patient tolerated treatment well    Behavior During Therapy  [facility] for tasks assessed/performed       Past Medical History:  Diagnosis Date  . Medical history non-contributory   . Pulmonary embolism [facility](HCC)     Past Surgical History:  Procedure Laterality Date  . APPENDECTOMY    . KNEE ARTHROSCOPY Left 11/04/2016   Procedure: ARTHROSCOPY KNEE, Partial MEdial and Lateral Meniscectomy;  Surgeon: Donato HeinzJames P Hooten, MD;  Location: [facility] ORS;  Service: Orthopedics;  Laterality: Left;  . ORIF ULNAR FRACTURE Right 01/10/2016   Procedure: OPEN REDUCTION INTERNAL FIXATION (ORIF) ULNAR/RADIUS FRACTURE;  Surgeon: Donato HeinzJames P Hooten, MD;  Location: [facility] ORS;  Service: Orthopedics;  Laterality: Right;  . torn meniscus Left   . WRIST FRACTURE SURGERY Right     There were no vitals filed for this visit.   Subjective Assessment - 09/13/19 0808    Subjective  Patient has had some back pain for about 10 years.  He denies any imaging.  Wakes up in the morning in pain, reports that he has seen a chiropractor with some relief    Limitations  Lifting;Standing;House hold activities    Patient Stated Goals  have less pain    Currently in Pain?  Yes    Pain Score  9     Pain Location  Back    Pain Orientation  Right;Lower    Pain Descriptors / Indicators  Aching    Pain Type  Chronic pain    Pain Radiating Towards  denies    Pain Onset  More than a month ago    Pain Frequency   Constant    Aggravating Factors   worse in the AM, standing, some lifting pain up to 10/10    Pain Relieving Factors  heat, ice, as the day goes it is a little better, pain 6/10    Effect of Pain on Daily Activities  just hurts all the time         [facility] - 09/13/19 0001      Assessment   Medical Diagnosis  LBP, DDD, SI joint pain    Referring Provider (PT)  Kirsteins    Onset Date/Surgical Date  08/13/19    Prior Therapy  has seen chiropractor      Precautions   Precautions  None      Balance Screen   Has the patient fallen in the past 6 months  No    Has the patient had a decrease in activity level because of a fear of falling?   No    Is the patient reluctant to leave their home because of a fear of falling?   No      Home Environment   Additional Comments  has a 36 year old, has stairs      Prior Function   Level of Independence  Independent  Vocation  Full time employment    Vocation Requirements  mostly standing, some lifting    Leisure  no real exercise      Posture/Postural Control   Posture Comments  fwd head, rounded shoulders, slouched posture      ROM / Strength   AROM / PROM / Strength  AROM;Strength      AROM   Overall AROM Comments  Lumbar ROM is decreased 50% with c/o stiffness and some pain      Strength   Overall Strength Comments  4/5 for the LE's      Flexibility   Soft Tissue Assessment /Muscle Length  yes    Hamstrings  tight    Piriformis  tight      Palpation   Palpation comment  tight and tender in the lumbar area, has a knot in the right SI area                Objective measurements completed on examination: See above findings.      [place] Adult PT Treatment/Exercise - 09/13/19 0001      Modalities   Modalities  Moist Heat;Electrical Stimulation      Moist Heat Therapy   Number Minutes Moist Heat  15 Minutes    Moist Heat Location  Lumbar Spine      Electrical Stimulation   Electrical Stimulation  Location  right lumbar     Electrical Stimulation Action  IFC    Electrical Stimulation Parameters  supine    Electrical Stimulation Goals  Pain               PT Short Term Goals - 09/13/19 0838      PT SHORT TERM GOAL #1   Title  independent with initial HEP    Time  2    Period  Weeks    Status  New        PT Long Term Goals - 09/13/19 0623      PT LONG TERM GOAL #1   Title  understand posture and body mechanics    Time  8    Period  Weeks    Status  New      PT LONG TERM GOAL #2   Title  increase lumbar ROM to WNL's    Time  8    Period  Weeks    Status  New      PT LONG TERM GOAL #3   Title  decrease pain 50% in th eAM    Time  8    Period  Weeks    Status  New      PT LONG TERM GOAL #4   Title  lift child without difficulty    Time  8    Period  Weeks    Status  New             Plan - 09/13/19 7628    Clinical Impression Statement  Patient reports that he has had some LBP since he was in the [employer].  He reports that over the past year his pain has been worse, he has been seeing a Restaurant manager, fast food.  He reports that he is worse in the AM, MD dx was DDD and SI pain.  He has limited lumbar ROM, spasms and a knot in the right SI area.    Stability/Clinical Decision Making  Stable/Uncomplicated    Clinical Decision Making  Low    Rehab Potential  Good    PT  Frequency  2x / week    PT Duration  8 weeks    PT Treatment/Interventions  ADLs/Self Care Home Management;Electrical Stimulation;Moist Heat;Traction;Cryotherapy;Ultrasound;Therapeutic activities;Therapeutic exercise;Neuromuscular re-education;Manual techniques;Patient/family education    PT Next Visit Plan  start gym for core stability and flexibility, could try traction    Consulted and Agree with Plan of Care  Patient       Patient will benefit from skilled therapeutic intervention in order to improve the following deficits and impairments:  Improper body mechanics, Pain, Postural  dysfunction, Increased muscle spasms, Decreased range of motion, Decreased strength, Impaired flexibility  Visit Diagnosis: Chronic right-sided low back pain without sciatica - Plan: PT plan of care cert/re-cert  Muscle spasm of back - Plan: PT plan of care cert/re-cert     Problem List Patient Active Problem List   Diagnosis Date Noted  . Right forearm fracture 01/10/2016    Jearld Lesch., PT 09/13/2019, 8:43 AM  [facility] [address] [place], [place], [address] Phone: [phone]   Fax:  [phone]  Name: Rami Budhu MRN: [id number] Date of Birth: 1980/12/27

## 2019-09-13 NOTE — Patient Instructions (Signed)
Access Code: [email or web address]  URL: [email or web address]  Date: 09/13/2019  Prepared by: Lum Babe   Exercises Hooklying Single Knee to Chest - 10 reps - 1 sets - 10 hold - 2x daily - 7x weekly Supine Double Knee to Chest - 10 reps - 1 sets - 10 hold - 2x daily                            - 7x weekly Supine Lower Trunk Rotation - 10 reps - 1 sets - 10 hold - 2x daily - 7x weekly Supine Piriformis Stretch Pulling Heel to Hip - 5 reps - 1 sets - 30 hold - 2x daily - 7x weekly

## 2019-09-22 ENCOUNTER — Ambulatory Visit
Payer: No Typology Code available for payment source | Attending: Physical Medicine & Rehabilitation | Admitting: Physical Therapy

## 2019-09-22 ENCOUNTER — Encounter: Payer: Self-pay | Admitting: Physical Therapy

## 2019-09-22 ENCOUNTER — Other Ambulatory Visit: Payer: Self-pay

## 2019-09-22 DIAGNOSIS — M62830 Muscle spasm of back: Secondary | ICD-10-CM | POA: Diagnosis present

## 2019-09-22 DIAGNOSIS — M545 Low back pain: Secondary | ICD-10-CM | POA: Diagnosis present

## 2019-09-22 DIAGNOSIS — G8929 Other chronic pain: Secondary | ICD-10-CM | POA: Diagnosis present

## 2019-09-22 NOTE — Therapy (Signed)
[place], [place], [address] Phone: [phone]   Fax:  [phone]  Physical Therapy Treatment  Patient Details  Name: Kevin Decker MRN: [id number] Date of Birth: 1980/11/07 Referring Provider (PT): Kirsteins   Encounter Date: 09/22/2019  PT End of Session - 09/22/19 0913    Visit Number  2    Date for PT Re-Evaluation  11/13/19    PT Start Time  0846    PT Stop Time  0930    PT Time Calculation (min)  44 min    Activity Tolerance  Patient tolerated treatment well    Behavior During Therapy  [facility] for tasks assessed/performed       Past Medical History:  Diagnosis Date  . Medical history non-contributory   . Pulmonary embolism [facility])     Past Surgical History:  Procedure Laterality Date  . APPENDECTOMY    . KNEE ARTHROSCOPY Left 11/04/2016   Procedure: ARTHROSCOPY KNEE, Partial MEdial and Lateral Meniscectomy;  Surgeon: Dereck Leep, MD;  Location: [facility] ORS;  Service: Orthopedics;  Laterality: Left;  . ORIF ULNAR FRACTURE Right 01/10/2016   Procedure: OPEN REDUCTION INTERNAL FIXATION (ORIF) ULNAR/RADIUS FRACTURE;  Surgeon: Dereck Leep, MD;  Location: [facility] ORS;  Service: Orthopedics;  Laterality: Right;  . torn meniscus Left   . WRIST FRACTURE SURGERY Right     There were no vitals filed for this visit.  Subjective Assessment - 09/22/19 0847    Subjective  It always hurts, the heat felt really good    Currently in Pain?  Yes    Pain Score  8     Pain Location  Back    Pain Orientation  Lower    Aggravating Factors   AM is worse                       [facility] Adult PT Treatment/Exercise - 09/22/19 0001      Exercises   Exercises  Lumbar      Lumbar Exercises: Stretches   Passive Hamstring Stretch  Right;Left;3 reps;20 seconds    Piriformis Stretch  Right;Left;3 reps;20 seconds      Lumbar Exercises: Aerobic   Nustep  level 5 x 6 minutes      Lumbar  Exercises: Machines for Strengthening   Leg Press  50# 2x10    Other Lumbar Machine Exercise  seated row 25#,  lat pulls 25# 2x10       Lumbar Exercises: Supine   Other Supine Lumbar Exercises  feet on ball K2C, trunk rotation, small bridges and isometric abs      Modalities   Modalities  Moist Heat;Electrical Stimulation      Moist Heat Therapy   Number Minutes Moist Heat  15 Minutes    Moist Heat Location  Lumbar Spine      Electrical Stimulation   Electrical Stimulation Location  right lumbar     Electrical Stimulation Action  IFC    Electrical Stimulation Parameters  supine    Electrical Stimulation Goals  Pain               PT Short Term Goals - 09/22/19 0915      PT SHORT TERM GOAL #1   Title  independent with initial HEP    Status  Partially Met        PT Long Term Goals - 09/13/19 6468      PT LONG TERM GOAL #  1   Title  understand posture and body mechanics    Time  8    Period  Weeks    Status  New      PT LONG TERM GOAL #2   Title  increase lumbar ROM to WNL's    Time  8    Period  Weeks    Status  New      PT LONG TERM GOAL #3   Title  decrease pain 50% in th eAM    Time  8    Period  Weeks    Status  New      PT LONG TERM GOAL #4   Title  lift child without difficulty    Time  8    Period  Weeks    Status  New            Plan - 09/22/19 0914    Clinical Impression Statement  Initiated exercises today without any increae of pain, he has tightness in the piriformis mms.  Cues needed for form on the exercises    PT Next Visit Plan  start gym for core stability and flexibility, could try traction    Consulted and Agree with Plan of Care  Patient       Patient will benefit from skilled therapeutic intervention in order to improve the following deficits and impairments:  Improper body mechanics, Pain, Postural dysfunction, Increased muscle spasms, Decreased range of motion, Decreased strength, Impaired flexibility  Visit  Diagnosis: Chronic right-sided low back pain without sciatica  Muscle spasm of back     Problem List Patient Active Problem List   Diagnosis Date Noted  . Right forearm fracture 01/10/2016    Sumner Boast., PT 09/22/2019, 9:16 AM  [place], [place], [address] Phone: [phone]   Fax:  [phone]  Name: Kevin Decker MRN: [id number] Date of Birth: 09-17-1981

## 2019-09-24 ENCOUNTER — Ambulatory Visit: Payer: No Typology Code available for payment source | Admitting: Physical Therapy

## 2019-09-27 ENCOUNTER — Other Ambulatory Visit: Payer: Self-pay

## 2019-09-27 ENCOUNTER — Ambulatory Visit: Payer: No Typology Code available for payment source | Admitting: Physical Therapy

## 2019-09-27 DIAGNOSIS — M545 Low back pain: Secondary | ICD-10-CM | POA: Diagnosis not present

## 2019-09-27 DIAGNOSIS — G8929 Other chronic pain: Secondary | ICD-10-CM

## 2019-09-27 NOTE — Therapy (Signed)
[place], [place], [address] Phone: [phone]   Fax:  [phone]  Physical Therapy Treatment  Patient Details  Name: Giuseppe Duchemin MRN: [id number] Date of Birth: [birth year] Referring Provider (PT): Kirsteins   Encounter Date: 09/27/2019  PT End of Session - 09/27/19 0923    Visit Number  3    Date for PT Re-Evaluation  11/13/19    PT Start Time  0850    PT Stop Time  0938    PT Time Calculation (min)  48 min       Past Medical History:  Diagnosis Date  . Medical history non-contributory   . Pulmonary embolism [facility])     Past Surgical History:  Procedure Laterality Date  . APPENDECTOMY    . KNEE ARTHROSCOPY Left 11/04/2016   Procedure: ARTHROSCOPY KNEE, Partial MEdial and Lateral Meniscectomy;  Surgeon: Dereck Leep, MD;  Location: [facility] ORS;  Service: Orthopedics;  Laterality: Left;  . ORIF ULNAR FRACTURE Right 01/10/2016   Procedure: OPEN REDUCTION INTERNAL FIXATION (ORIF) ULNAR/RADIUS FRACTURE;  Surgeon: Dereck Leep, MD;  Location: [facility] ORS;  Service: Orthopedics;  Laterality: Right;  . torn meniscus Left   . WRIST FRACTURE SURGERY Right     There were no vitals filed for this visit.  Subjective Assessment - 09/27/19 0854    Subjective  "doing the same as always, pain in the back"    Limitations  Lifting;Standing;House hold activities    Patient Stated Goals  have less pain    Currently in Pain?  Yes    Pain Location  Back    Pain Orientation  Lower                       [facility] Adult PT Treatment/Exercise - 09/27/19 0001      Lumbar Exercises: Stretches   Passive Hamstring Stretch  Right;Left;5 reps;20 seconds    Piriformis Stretch  Right;Left;5 reps;20 seconds      Lumbar Exercises: Aerobic   Nustep  level 5 x 6 minutes      Lumbar Exercises: Machines for Strengthening   Leg Press  60# 2 x 10    Other Lumbar Machine Exercise  seated row 25#,   lat pulls 25# 2x10       Lumbar Exercises: Standing   Other Standing Lumbar Exercises  ab sets, OHP w/ weighted ball 2 x 10      Lumbar Exercises: Seated   Other Seated Lumbar Exercises  lumbar extension black band, 2 x 10      Modalities   Modalities  Moist Heat;Electrical Stimulation      Moist Heat Therapy   Number Minutes Moist Heat  15 Minutes    Moist Heat Location  Lumbar Spine      Electrical Stimulation   Electrical Stimulation Location  right lumbar     Electrical Stimulation Action  IFC    Electrical Stimulation Parameters  supine    Electrical Stimulation Goals  Pain               PT Short Term Goals - 09/22/19 0915      PT SHORT TERM GOAL #1   Title  independent with initial HEP    Status  Partially Met        PT Long Term Goals - 09/13/19 2229      PT LONG TERM GOAL #1   Title  understand posture and body  mechanics    Time  8    Period  Weeks    Status  New      PT LONG TERM GOAL #2   Title  increase lumbar ROM to WNL's    Time  8    Period  Weeks    Status  New      PT LONG TERM GOAL #3   Title  decrease pain 50% in th eAM    Time  8    Period  Weeks    Status  New      PT LONG TERM GOAL #4   Title  lift child without difficulty    Time  8    Period  Weeks    Status  New            Plan - 09/27/19 1572    Clinical Impression Statement  pt tolerated treatment well as demostrated by no increase of pain with interventions. pt able to add weight to leg press today. lumbar extension, OHP, and ab sets added today. cues needed for form on the exercises. pt is tight with piriformis stretch.    Stability/Clinical Decision Making  Stable/Uncomplicated    Clinical Decision Making  Low    Rehab Potential  Good    PT Frequency  2x / week    PT Duration  8 weeks    PT Treatment/Interventions  ADLs/Self Care Home Management;Electrical Stimulation;Moist Heat;Traction;Cryotherapy;Ultrasound;Therapeutic activities;Therapeutic  exercise;Neuromuscular re-education;Manual techniques;Patient/family education    PT Next Visit Plan  core stability and flexibility, could try traction    Consulted and Agree with Plan of Care  Patient       Patient will benefit from skilled therapeutic intervention in order to improve the following deficits and impairments:  Improper body mechanics, Pain, Postural dysfunction, Increased muscle spasms, Decreased range of motion, Decreased strength, Impaired flexibility  Visit Diagnosis: Chronic right-sided low back pain without sciatica     Problem List Patient Active Problem List   Diagnosis Date Noted  . Right forearm fracture 01/10/2016   [facility] [place] 09/27/2019, 10:36 AM  [place], [place], [address] Phone: [phone]   Fax:  [phone]  Name: Fontaine Hehl MRN: [id number] Date of Birth: 08/04/1981

## 2019-09-28 ENCOUNTER — Encounter
Payer: No Typology Code available for payment source | Attending: Physical Medicine & Rehabilitation | Admitting: Physical Medicine & Rehabilitation

## 2019-09-28 ENCOUNTER — Encounter: Payer: Self-pay | Admitting: Physical Medicine & Rehabilitation

## 2019-09-28 ENCOUNTER — Other Ambulatory Visit: Payer: Self-pay

## 2019-09-28 VITALS — BP 109/74 | HR 74 | Temp 97.7°F | Ht 73.0 in | Wt 219.0 lb

## 2019-09-28 DIAGNOSIS — M5136 Other intervertebral disc degeneration, lumbar region: Secondary | ICD-10-CM | POA: Diagnosis present

## 2019-09-28 DIAGNOSIS — M533 Sacrococcygeal disorders, not elsewhere classified: Secondary | ICD-10-CM

## 2019-09-28 NOTE — Progress Notes (Signed)
Subjective:    Patient ID: Kevin Decker, male    DOB: Apr 19, 1981, 38 y.o.   MRN: [id number]  HPI   38 year old male with history of chronic right-sided low back pain.  He does not have any pain radiating down the right lower extremity.  No numbness or tingling in the legs no weakness in the legs no bowel or bladder dysfunction.  He was seen approximately 1 month ago for initial evaluation.  Patient continues have pain that is exacerbated by bending forward and relieved by bending backwards. The patient has started physical therapy and his last treatment was yesterday.  He also performs a home exercise program which includes Hamstring stretches Pelvic lifts Completed 3 PT session and has another session scheduled for the 16th. The patient has had no new injury since I last evaluated him. Pain Inventory Average Pain 7 Pain Right Now 7 My pain is sharp and aching  In the last 24 hours, has pain interfered with the following? General activity 5 Relation with others 0 Enjoyment of life 5 What TIME of day is your pain at its worst? morning Sleep (in general) Fair  Pain is worse with: standing and some activites Pain improves with: heat/ice, therapy/exercise, medication and TENS Relief from Meds: 5  Mobility walk without assistance ability to climb steps?  yes do you drive?  yes  Function employed # of hrs/week .  Neuro/Psych depression  Prior Studies Any changes since last visit?  no  Physicians involved in your care Any changes since last visit?  no   No family history on file. Social History   Socioeconomic History  . Marital status: Single    Spouse name: Not on file  . Number of children: Not on file  . Years of education: Not on file  . Highest education level: Not on file  Occupational History  . Not on file  Tobacco Use  . Smoking status: Never Smoker  . Smokeless tobacco: Never Used  Substance and Sexual Activity  . Alcohol use: Yes    Comment:  social  . Drug use: No  . Sexual activity: Not on file  Other Topics Concern  . Not on file  Social History Narrative  . Not on file   Social Determinants of Health   Financial Resource Strain:   . Difficulty of Paying Living Expenses: Not on file  Food Insecurity:   . Worried About Programme researcher, broadcasting/film/video in the Last Year: Not on file  . Ran Out of Food in the Last Year: Not on file  Transportation Needs:   . Lack of Transportation (Medical): Not on file  . Lack of Transportation (Non-Medical): Not on file  Physical Activity:   . Days of Exercise per Week: Not on file  . Minutes of Exercise per Session: Not on file  Stress:   . Feeling of Stress : Not on file  Social Connections:   . Frequency of Communication with Friends and Family: Not on file  . Frequency of Social Gatherings with Friends and Family: Not on file  . Attends Religious Services: Not on file  . Active Member of Clubs or Organizations: Not on file  . Attends Banker Meetings: Not on file  . Marital Status: Not on file   Past Surgical History:  Procedure Laterality Date  . APPENDECTOMY    . KNEE ARTHROSCOPY Left 11/04/2016   Procedure: ARTHROSCOPY KNEE, Partial MEdial and Lateral Meniscectomy;  Surgeon: Donato Heinz, MD;  Location:  [facility] ORS;  Service: Orthopedics;  Laterality: Left;  . ORIF ULNAR FRACTURE Right 01/10/2016   Procedure: OPEN REDUCTION INTERNAL FIXATION (ORIF) ULNAR/RADIUS FRACTURE;  Surgeon: Dereck Leep, MD;  Location: [facility] ORS;  Service: Orthopedics;  Laterality: Right;  . torn meniscus Left   . WRIST FRACTURE SURGERY Right    Past Medical History:  Diagnosis Date  . Medical history non-contributory   . Pulmonary embolism ([place])    There were no vitals taken for this visit.  Opioid Risk Score:   Fall Risk Score:  `1  Depression screen PHQ 2/9  No flowsheet data found.   Review of Systems  Constitutional: Negative.   HENT: Negative.   Eyes: Negative.   Respiratory:  Negative.   Cardiovascular: Negative.   Gastrointestinal: Negative.   Endocrine: Negative.   Genitourinary: Negative.   Musculoskeletal: Positive for arthralgias and back pain.  Skin: Negative.   Allergic/Immunologic: Negative.   Neurological: Negative.   Hematological: Negative.   Psychiatric/Behavioral: Positive for dysphoric mood.  All other systems reviewed and are negative.      Objective:   Physical Exam Vitals and nursing note reviewed.  Constitutional:      Appearance: Normal appearance. He is normal weight.  HENT:     Head: Normocephalic and atraumatic.  Eyes:     Extraocular Movements: Extraocular movements intact.     Conjunctiva/sclera: Conjunctivae normal.     Pupils: Pupils are equal, round, and reactive to light.  Musculoskeletal:     Thoracic back: No deformity. Decreased range of motion. No scoliosis.     Lumbar back: Tenderness present. Decreased range of motion. Negative right straight leg raise test and negative left straight leg raise test. No scoliosis.     Right hip: Normal.     Left hip: Normal.     Comments: Patient has tenderness around the right L5 paraspinal area.  Skin:    General: Skin is warm and dry.  Neurological:     General: No focal deficit present.     Mental Status: He is alert and oriented to person, place, and time.     Sensory: Sensation is intact.     Motor: Motor function is intact.     Coordination: Coordination is intact.     Gait: Gait is intact.     Deep Tendon Reflexes:     Reflex Scores:      Patellar reflexes are 1+ on the right side and 1+ on the left side.      Achilles reflexes are 1+ on the right side and 1+ on the left side.    Comments: Motor strength is 5/5 bilateral hip flexor knee extensor ankle dorsiflexor.  Psychiatric:        Mood and Affect: Mood normal.        Behavior: Behavior normal.        Thought Content: Thought content normal.           Assessment & Plan:  #1.  Chronic right-sided low back  pain without sciatica.  He has pain exacerbated by forward flexion.  He has no radicular component.  We discussed differential diagnosis include discogenic pain likely from L5-S1 plus  sacroiliac disorder Continue outpatient physical therapy 2-3 times per week for the next 6 weeks.  I will see him back in 6 weeks If he has residual discomfort that interferes with activity may consider sacroiliac injection under fluoroscopic guidance.  The patient wishes to avoid interventional procedures

## 2019-09-29 ENCOUNTER — Ambulatory Visit: Payer: No Typology Code available for payment source | Admitting: Physical Therapy

## 2019-09-29 DIAGNOSIS — M545 Low back pain, unspecified: Secondary | ICD-10-CM

## 2019-09-29 DIAGNOSIS — G8929 Other chronic pain: Secondary | ICD-10-CM

## 2019-09-29 DIAGNOSIS — M62830 Muscle spasm of back: Secondary | ICD-10-CM

## 2019-09-29 NOTE — Therapy (Signed)
[place], [place], [address] Phone: [phone]   Fax:  [phone]  Physical Therapy Treatment  Patient Details  Name: Kevin Decker MRN: [id number] Date of Birth: 08-13-1981 Referring Provider (PT): Kirsteins   Encounter Date: 09/29/2019  PT End of Session - 09/29/19 0838    Visit Number  4    Date for PT Re-Evaluation  11/13/19    PT Start Time  0800    PT Stop Time  0850    PT Time Calculation (min)  50 min       Past Medical History:  Diagnosis Date  . Medical history non-contributory   . Pulmonary embolism [facility])     Past Surgical History:  Procedure Laterality Date  . APPENDECTOMY    . KNEE ARTHROSCOPY Left 11/04/2016   Procedure: ARTHROSCOPY KNEE, Partial MEdial and Lateral Meniscectomy;  Surgeon: Dereck Leep, MD;  Location: [facility] ORS;  Service: Orthopedics;  Laterality: Left;  . ORIF ULNAR FRACTURE Right 01/10/2016   Procedure: OPEN REDUCTION INTERNAL FIXATION (ORIF) ULNAR/RADIUS FRACTURE;  Surgeon: Dereck Leep, MD;  Location: [facility] ORS;  Service: Orthopedics;  Laterality: Right;  . torn meniscus Left   . WRIST FRACTURE SURGERY Right     There were no vitals filed for this visit.  Subjective Assessment - 09/29/19 0804    Subjective  "feeling all right"    Currently in Pain?  Yes    Pain Score  7     Pain Location  Back                       [facility] Adult PT Treatment/Exercise - 09/29/19 0001      Lumbar Exercises: Aerobic   Nustep  level 5 x 6 minutes      Lumbar Exercises: Machines for Strengthening   Leg Press  60# 2 x 15    Other Lumbar Machine Exercise  seated row 25#,  lat pulls 25# 2x15      Lumbar Exercises: Standing   Other Standing Lumbar Exercises  overhead extension w/ ball, star scapular stabilization      Lumbar Exercises: Seated   Other Seated Lumbar Exercises  lumbar extension black band, 2 x 15      Modalities   Modalities   Traction      Moist Heat Therapy   Number Minutes Moist Heat  15 Minutes    Moist Heat Location  Lumbar Spine      Traction   Type of Traction  Cervical    Max (lbs)  12    Hold Time  10    Time  10               PT Short Term Goals - 09/29/19 0804      PT SHORT TERM GOAL #1   Title  independent with initial HEP    Status  Achieved        PT Long Term Goals - 09/29/19 0804      PT LONG TERM GOAL #1   Title  understand posture and body mechanics    Status  On-going      PT LONG TERM GOAL #2   Title  increase lumbar ROM to WNL's    Status  Partially Met      PT LONG TERM GOAL #3   Title  decrease pain 50% in th eAM    Status  On-going  Plan - 09/29/19 0840    Clinical Impression Statement  pt able to increase reps on all machines. overhead extension and scapular stabilization with theraband were added. pt has no increase of pain with exercises. pt agreed to try traction to relieve pain. he tolerated it well and had no complaints of pain.    Stability/Clinical Decision Making  Stable/Uncomplicated    Rehab Potential  Good    PT Frequency  2x / week    PT Duration  8 weeks    PT Treatment/Interventions  ADLs/Self Care Home Management;Electrical Stimulation;Moist Heat;Traction;Cryotherapy;Ultrasound;Therapeutic activities;Therapeutic exercise;Neuromuscular re-education;Manual techniques;Patient/family education    PT Next Visit Plan  core stability and flexibility       Patient will benefit from skilled therapeutic intervention in order to improve the following deficits and impairments:  Improper body mechanics, Pain, Postural dysfunction, Increased muscle spasms, Decreased range of motion, Decreased strength, Impaired flexibility  Visit Diagnosis: Chronic right-sided low back pain without sciatica  Muscle spasm of back     Problem List Patient Active Problem List   Diagnosis Date Noted  . Right forearm fracture 01/10/2016     Barrett Henle, [place] 09/29/2019, 8:45 AM  [place], [place], [address] Phone: [phone]   Fax:  [phone]  Name: Kevin Decker MRN: [id number] Date of Birth: [birth year]

## 2019-10-11 ENCOUNTER — Ambulatory Visit: Payer: No Typology Code available for payment source | Admitting: Physical Therapy

## 2019-10-11 ENCOUNTER — Other Ambulatory Visit: Payer: Self-pay

## 2019-10-11 ENCOUNTER — Encounter: Payer: Self-pay | Admitting: Physical Therapy

## 2019-10-11 DIAGNOSIS — M545 Low back pain, unspecified: Secondary | ICD-10-CM

## 2019-10-11 DIAGNOSIS — M62830 Muscle spasm of back: Secondary | ICD-10-CM

## 2019-10-11 DIAGNOSIS — G8929 Other chronic pain: Secondary | ICD-10-CM

## 2019-10-11 NOTE — Therapy (Signed)
[place], [place], [address] Phone: [phone]   Fax:  [phone]  Physical Therapy Treatment  Patient Details  Name: Kevin Decker MRN: [id number] Date of Birth: 26-May-1981 Referring Provider (PT): Kirsteins   Encounter Date: 10/11/2019  PT End of Session - 10/11/19 9211    Visit Number  5    Date for PT Re-Evaluation  11/13/19    PT Start Time  0845    PT Stop Time  0940    PT Time Calculation (min)  55 min    Behavior During Therapy  [facility] for tasks assessed/performed       Past Medical History:  Diagnosis Date  . Medical history non-contributory   . Pulmonary embolism [facility])     Past Surgical History:  Procedure Laterality Date  . APPENDECTOMY    . KNEE ARTHROSCOPY Left 11/04/2016   Procedure: ARTHROSCOPY KNEE, Partial MEdial and Lateral Meniscectomy;  Surgeon: Dereck Leep, MD;  Location: [facility] ORS;  Service: Orthopedics;  Laterality: Left;  . ORIF ULNAR FRACTURE Right 01/10/2016   Procedure: OPEN REDUCTION INTERNAL FIXATION (ORIF) ULNAR/RADIUS FRACTURE;  Surgeon: Dereck Leep, MD;  Location: [facility] OR[facility];  Service: Orthopedics;  Laterality: Right;  . torn meniscus Left   . WRIST FRACTURE SURGERY Right     There were no vitals filed for this visit.  Subjective Assessment - 10/11/19 0846    Subjective  Random shooting pain in his lower back. "It starts in my lower back and ends in my lower back."    Limitations  Lifting;Standing;House hold activities    Patient Stated Goals  have less pain    Currently in Pain?  No/denies    Pain Score  7     Pain Location  Back                       [facility] Adult PT Treatment/Exercise - 10/11/19 0001      Lumbar Exercises: Aerobic   Nustep  level 6 x 6 minutes      Lumbar Exercises: Machines for Strengthening   Cybex Lumbar Extension  black band 2x10    Other Lumbar Machine Exercise  seated row 35#,  lat pulls 35# 2x15       Lumbar Exercises: Standing   Other Standing Lumbar Exercises  Hip exy & and 10lb  x10 each    Other Standing Lumbar Exercises  overhead extension w/ ball, star scapular stabilization, AR press 35lb x 10 ea h       Lumbar Exercises: Supine   Other Supine Lumbar Exercises  feet on ball K2C, trunk rotation, small bridges and isometric abs      Moist Heat Therapy   Number Minutes Moist Heat  15 Minutes    Moist Heat Location  Lumbar Spine      Electrical Stimulation   Electrical Stimulation Location  right lumbar     Electrical Stimulation Action  --   IFC   Electrical Stimulation Parameters  supine    Electrical Stimulation Goals  Pain               PT Short Term Goals - 09/29/19 0804      PT SHORT TERM GOAL #1   Title  independent with initial HEP    Status  Achieved        PT Long Term Goals - 10/11/19 9417      PT LONG TERM GOAL #  1   Title  understand posture and body mechanics    Status  Achieved      PT LONG TERM GOAL #2   Title  increase lumbar ROM to WNL's    Status  Partially Met      PT LONG TERM GOAL #3   Title  decrease pain 50% in th eAM    Status  Partially Met      PT LONG TERM GOAL #4   Title  lift child without difficulty    Status  On-going            Plan - 10/11/19 6270    Clinical Impression Statement  Pt did well with a progressed treatment session. Mote focus put on lumbar extension. he report the most relief with extension movements. Some weakness exposes with supine K2C and with anti rotational presses. Cues to keep arm straight with overhead extensions needed.    Stability/Clinical Decision Making  Stable/Uncomplicated    Rehab Potential  Good    PT Frequency  2x / week    PT Duration  8 weeks    PT Treatment/Interventions  ADLs/Self Care Home Management;Electrical Stimulation;Moist Heat;Traction;Cryotherapy;Ultrasound;Therapeutic activities;Therapeutic exercise;Neuromuscular re-education;Manual techniques;Patient/family  education    PT Next Visit Plan  core stability and flexibility, may add lumbar traction.       Patient will benefit from skilled therapeutic intervention in order to improve the following deficits and impairments:  Improper body mechanics, Pain, Postural dysfunction, Increased muscle spasms, Decreased range of motion, Decreased strength, Impaired flexibility  Visit Diagnosis: Muscle spasm of back  Chronic right-sided low back pain without sciatica     Problem List Patient Active Problem List   Diagnosis Date Noted  . Right forearm fracture 01/10/2016    Scot Jun, PTA 10/11/2019, 9:24 AM  [place], [place], [address] Phone: [phone]   Fax:  [phone]  Name: Kevin Decker MRN: [id number] Date of Birth: August 15, 1981

## 2019-10-13 ENCOUNTER — Ambulatory Visit: Payer: No Typology Code available for payment source | Admitting: Physical Therapy

## 2019-10-18 ENCOUNTER — Encounter: Payer: Self-pay | Admitting: Physical Therapy

## 2019-10-18 ENCOUNTER — Other Ambulatory Visit: Payer: Self-pay

## 2019-10-18 ENCOUNTER — Ambulatory Visit
Payer: No Typology Code available for payment source | Attending: Physical Medicine & Rehabilitation | Admitting: Physical Therapy

## 2019-10-18 DIAGNOSIS — G8929 Other chronic pain: Secondary | ICD-10-CM | POA: Diagnosis present

## 2019-10-18 DIAGNOSIS — M545 Low back pain: Secondary | ICD-10-CM | POA: Insufficient documentation

## 2019-10-18 DIAGNOSIS — M62830 Muscle spasm of back: Secondary | ICD-10-CM | POA: Diagnosis not present

## 2019-10-18 NOTE — Therapy (Signed)
[place], [place], [address] Phone: [phone]   Fax:  [phone]  Physical Therapy Treatment  Patient Details  Name: Kevin Decker MRN: [id number] Date of Birth: 1981-05-07 Referring Provider (PT): Kirsteins   Encounter Date: 10/18/2019  PT End of Session - 10/18/19 0929    Visit Number  6    Date for PT Re-Evaluation  11/13/19    PT Start Time  0846    PT Stop Time  0942    PT Time Calculation (min)  56 min    Activity Tolerance  Patient tolerated treatment well    Behavior During Therapy  [facility] for tasks assessed/performed       Past Medical History:  Diagnosis Date  . Medical history non-contributory   . Pulmonary embolism [facility])     Past Surgical History:  Procedure Laterality Date  . APPENDECTOMY    . KNEE ARTHROSCOPY Left 11/04/2016   Procedure: ARTHROSCOPY KNEE, Partial MEdial and Lateral Meniscectomy;  Surgeon: Dereck Leep, MD;  Location: [facility];  Service: Orthopedics;  Laterality: Left;  . ORIF ULNAR FRACTURE Right 01/10/2016   Procedure: OPEN REDUCTION INTERNAL FIXATION (ORIF) ULNAR/RADIUS FRACTURE;  Surgeon: Dereck Leep, MD;  Location: [facility];  Service: Orthopedics;  Laterality: Right;  . torn meniscus Left   . WRIST FRACTURE SURGERY Right     There were no vitals filed for this visit.  Subjective Assessment - 10/18/19 0849    Subjective  "Feeling all right, about the usual"    Currently in Pain?  Yes    Pain Score  6     Pain Location  Back                       [facility] Adult PT Treatment/Exercise - 10/18/19 0001      Lumbar Exercises: Aerobic   Elliptical  I 11 R 5 2 min each way    UBE (Upper Arm Bike)  Constant work 30 watts 1.5 min each       Lumbar Exercises: Machines for Strengthening   Cybex Lumbar Extension  black band 2x15    Leg Press  60# 2 x 15    Other Lumbar Machine Exercise  seated row 45#,  lat pulls 45# 2x10    Other  Lumbar Machine Exercise  RDL 7ln dumbells       Lumbar Exercises: Standing   Other Standing Lumbar Exercises  overhead extension & cross body w/ ball, AR press 35lb x 10 ea h       Lumbar Exercises: Supine   Other Supine Lumbar Exercises  feet on ball K2C, trunk rotation, small bridges and isometric abs      Moist Heat Therapy   Number Minutes Moist Heat  15 Minutes    Moist Heat Location  Lumbar Spine      Electrical Stimulation   Electrical Stimulation Location  right lumbar     Electrical Stimulation Action  IFC    Electrical Stimulation Parameters  supine    Electrical Stimulation Goals  Pain               PT Short Term Goals - 09/29/19 0804      PT SHORT TERM GOAL #1   Title  independent with initial HEP    Status  Achieved        PT Long Term Goals - 10/11/19 3474      PT LONG  TERM GOAL #1   Title  understand posture and body mechanics    Status  Achieved      PT LONG TERM GOAL #2   Title  increase lumbar ROM to WNL's    Status  Partially Met      PT LONG TERM GOAL #3   Title  decrease pain 50% in th eAM    Status  Partially Met      PT LONG TERM GOAL #4   Title  lift child without difficulty    Status  On-going            Plan - 10/18/19 0929    Clinical Impression Statement  All interventions completed well. She does reports some hamstring tightness with RDL. Weakness exposed with the increase in weight doing anti rotational presses. Cues to complete full ROM on leg press.    Stability/Clinical Decision Making  Stable/Uncomplicated    Rehab Potential  Good    PT Frequency  2x / week    PT Duration  8 weeks    PT Treatment/Interventions  ADLs/Self Care Home Management;Electrical Stimulation;Moist Heat;Traction;Cryotherapy;Ultrasound;Therapeutic activities;Therapeutic exercise;Neuromuscular re-education;Manual techniques;Patient/family education    PT Next Visit Plan  core stability and flexibilityu, ,may add lumbar traction.        Patient will benefit from skilled therapeutic intervention in order to improve the following deficits and impairments:  Improper body mechanics, Pain, Postural dysfunction, Increased muscle spasms, Decreased range of motion, Decreased strength, Impaired flexibility  Visit Diagnosis: Muscle spasm of back  Chronic right-sided low back pain without sciatica     Problem List Patient Active Problem List   Diagnosis Date Noted  . Right forearm fracture 01/10/2016    Scot Jun, PTA 10/18/2019, 9:35 AM  [place], [place], [address] Phone: [phone]   Fax:  [phone]  Name: Kevin Decker MRN: [id number] Date of Birth: [birth year]

## 2019-10-20 ENCOUNTER — Encounter: Payer: Self-pay | Admitting: Physical Therapy

## 2019-10-20 ENCOUNTER — Ambulatory Visit: Payer: No Typology Code available for payment source | Admitting: Physical Therapy

## 2019-10-20 ENCOUNTER — Other Ambulatory Visit: Payer: Self-pay

## 2019-10-20 DIAGNOSIS — M62830 Muscle spasm of back: Secondary | ICD-10-CM

## 2019-10-20 DIAGNOSIS — G8929 Other chronic pain: Secondary | ICD-10-CM

## 2019-10-20 NOTE — Therapy (Signed)
[place], [place], [address] Phone: [phone]   Fax:  [phone]  Physical Therapy Treatment  Patient Details  Name: Kevin Decker MRN: [id number] Date of Birth: 02-26-1981 Referring Provider (PT): Kirsteins   Encounter Date: 10/20/2019  PT End of Session - 10/20/19 0933    Visit Number  7    Date for PT Re-Evaluation  11/13/19    PT Start Time  0846    PT Stop Time  0943    PT Time Calculation (min)  57 min    Activity Tolerance  Patient tolerated treatment well    Behavior During Therapy  [facility] for tasks assessed/performed       Past Medical History:  Diagnosis Date  . Medical history non-contributory   . Pulmonary embolism [facility])     Past Surgical History:  Procedure Laterality Date  . APPENDECTOMY    . KNEE ARTHROSCOPY Left 11/04/2016   Procedure: ARTHROSCOPY KNEE, Partial MEdial and Lateral Meniscectomy;  Surgeon: Dereck Leep, MD;  Location: [facility];  Service: Orthopedics;  Laterality: Left;  . ORIF ULNAR FRACTURE Right 01/10/2016   Procedure: OPEN REDUCTION INTERNAL FIXATION (ORIF) ULNAR/RADIUS FRACTURE;  Surgeon: Dereck Leep, MD;  Location: [facility];  Service: Orthopedics;  Laterality: Right;  . torn meniscus Left   . WRIST FRACTURE SURGERY Right     There were no vitals filed for this visit.  Subjective Assessment - 10/20/19 0849    Subjective  Pt reports that he feel like therapy is helping him maintain his functional mobility    Currently in Pain?  Yes    Pain Score  6     Pain Location  Back                       [facility] Adult PT Treatment/Exercise - 10/20/19 0001      Lumbar Exercises: Aerobic   Elliptical  I 13 R 7 2 min each way    UBE (Upper Arm Bike)  Constant work 30 watts 1.5 min each       Lumbar Exercises: Machines for Strengthening   Cybex Lumbar Extension  black band 2x15    Cybex Knee Extension  15lb 2x15     Cybex Knee Flexion   35lb 2x15     Other Lumbar Machine Exercise  seated row 45#,  lat pulls 45# 2x15    Other Lumbar Machine Exercise  RDL 7lb dumbells       Lumbar Exercises: Standing   Other Standing Lumbar Exercises  overhead extension & cross body w/ ball, AR press 35lb x 10 ea h       Moist Heat Therapy   Number Minutes Moist Heat  15 Minutes    Moist Heat Location  Lumbar Spine      Electrical Stimulation   Electrical Stimulation Location  right lumbar     Electrical Stimulation Action  IFC    Electrical Stimulation Parameters  supine    Electrical Stimulation Goals  Pain               PT Short Term Goals - 09/29/19 0804      PT SHORT TERM GOAL #1   Title  independent with initial HEP    Status  Achieved        PT Long Term Goals - 10/11/19 4034      PT LONG TERM GOAL #1   Title  understand  posture and body mechanics    Status  Achieved      PT LONG TERM GOAL #2   Title  increase lumbar ROM to WNL's    Status  Partially Met      PT LONG TERM GOAL #3   Title  decrease pain 50% in th eAM    Status  Partially Met      PT LONG TERM GOAL #4   Title  lift child without difficulty    Status  On-going            Plan - 10/20/19 0934    Clinical Impression Statement  Pt reports some low back pain with RDL despite having a flat back. He stated extension exercises felt good. Cues needed to maintain erect posture and to keep core engaged with seated rows. Good strength and ROM with seated leg curls and extensions.    Stability/Clinical Decision Making  Stable/Uncomplicated    Rehab Potential  Good    PT Frequency  2x / week    PT Duration  8 weeks    PT Treatment/Interventions  ADLs/Self Care Home Management;Electrical Stimulation;Moist Heat;Traction;Cryotherapy;Ultrasound;Therapeutic activities;Therapeutic exercise;Neuromuscular re-education;Manual techniques;Patient/family education    PT Next Visit Plan  core stability and flexibilityu, ,may add lumbar traction.        Patient will benefit from skilled therapeutic intervention in order to improve the following deficits and impairments:  Improper body mechanics, Pain, Postural dysfunction, Increased muscle spasms, Decreased range of motion, Decreased strength, Impaired flexibility  Visit Diagnosis: Muscle spasm of back  Chronic right-sided low back pain without sciatica     Problem List Patient Active Problem List   Diagnosis Date Noted  . Right forearm fracture 01/10/2016    Scot Jun, PTA 10/20/2019, 9:37 AM  [place], [place], [address] Phone: [phone]   Fax:  [phone]  Name: Olusegun Gerstenberger MRN: [id number] Date of Birth: 01/16/1981

## 2019-11-09 ENCOUNTER — Encounter
Payer: No Typology Code available for payment source | Attending: Physical Medicine & Rehabilitation | Admitting: Physical Medicine & Rehabilitation

## 2019-11-09 ENCOUNTER — Other Ambulatory Visit: Payer: Self-pay

## 2019-11-09 ENCOUNTER — Encounter: Payer: Self-pay | Admitting: Physical Medicine & Rehabilitation

## 2019-11-09 VITALS — BP 125/73 | HR 81 | Temp 98.4°F | Ht 73.0 in | Wt 217.0 lb

## 2019-11-09 DIAGNOSIS — M5136 Other intervertebral disc degeneration, lumbar region: Secondary | ICD-10-CM

## 2019-11-09 DIAGNOSIS — M533 Sacrococcygeal disorders, not elsewhere classified: Secondary | ICD-10-CM | POA: Diagnosis present

## 2019-11-09 NOTE — Patient Instructions (Signed)
Sacroiliac Joint Injection  A sacroiliac (SI) joint injection is a procedure to inject a numbing medicine (anesthetic block)--and sometimes a strong anti-inflammatory medicine (steroid)--into the SI joint. The SI joint is the joint between two bones of the pelvis called the sacrum and the ilium. The sacrum is the bone at the base of the spine. The ilium is the large bone that forms the hip. You may need this procedure if you have pain because of an inflamed or diseased SI joint. Various conditions can cause pain in the SI joint, including rheumatoid arthritis, gout, psoriatic arthritis, infection, or injury. SI joint pain is a common cause of low back pain. It may also cause pain in your buttock or leg. SI joint injection may be done to:  Find out if an anesthetic block relieves pain. This can confirm that the SI joint is the cause of pain (diagnostic use).  Treat a painful SI joint with steroids, anesthetic medicine, or both (therapeutic use). Tell a health care provider about:  Any allergies you have.  All medicines you are taking, including vitamins, herbs, eye drops, creams, and over-the-counter medicines.  Any problems you or family members have had with anesthetic medicines.  Any blood disorders you have.  Any surgeries you have had.  Any medical conditions you have.  Whether you are pregnant or may be pregnant. What are the risks? Generally, this is a safe procedure. However, problems may occur, including:  Infection.  Bleeding.  Nerve injury.  Temporary increase in pain.  Headache.  Failure of the procedure to relieve pain.  Bruising or soreness at the joint, in deep tissues, or at the injection site.  Allergic reactions to medicines or dyes.  Side effects from the steroid medicine. These may include facial flushing, increased appetite, diarrhea, and increased blood sugar. What happens before the procedure?  You may have a physical exam.  You may have imaging  tests, such as an X-ray, CT scan, or MRI.  Ask your health care provider about: ? Changing or stopping your regular medicines. This is especially important if you are taking diabetes medicines or blood thinners. ? Taking medicines such as aspirin and ibuprofen. These medicines can thin your blood. Do not take these medicines unless your health care provider tells you to take them. ? Taking over-the-counter medicines, vitamins, herbs, and supplements.  Plan to have someone take you home from the hospital or clinic. What happens during the procedure?  To lower your risk of infection: ? Your health care team will wash or sanitize their hands. ? Your skin will be washed with a germ-killing (antiseptic) solution.  You may be given one or more of the following: ? A medicine to help you relax (sedative). ? A medicine to numb the area (local anesthetic). Your health care provider will inject a local anesthetic into the skin above your SI joint.  You will be placed in the proper position on a procedure table to give the health care team the best access to your SI joint.  An X-ray machine that produces moving X-ray images (fluoroscopy) will be placed above the procedure table.  A long, thin needle will be inserted through your skin and down to your SI joint.  The position of the needle will be checked with fluoroscopy imaging.  An X-ray dye (contrast media) will be injected to make sure the needle enters the joint space. You may be asked if you feel any pain.  Long-acting anesthetic medicine will be injected. Long-acting steroid   medicine may also be injected.  The needle will be removed, and a bandage will be placed over the injection site. The procedure may vary among health care providers and hospitals. What happens after the procedure?  Your blood pressure, heart rate, breathing rate, and blood oxygen level will be monitored until the medicines you were given have worn off.  If dye was  used, you will be told to drink plenty of water to wash (flush) the dye out of your body.  You may be asked if you have pain relief from the injection.  You will likely be able to go home shortly after the procedure.  Your health care provider will give you instructions for taking care of yourself after the procedure. These may include instructions for doing physical therapy exercises.  Do not drive for 24 hours if you were given a sedative during the procedure. Summary  A sacroiliac (SI) joint injection is an injection of a numbing medicine (anesthetic block)--and sometimes a strong anti-inflammatory medicine (steroid)--into the SI joint.  You will be awake during the procedure, but the injection area will be made numb.  If you were given a medicine to help you relax (sedative during the procedure, do not drive for at least 24 hours. This information is not intended to replace advice given to you by your health care provider. Make sure you discuss any questions you have with your health care provider. Document Revised: 09/12/2017 Document Reviewed: 07/07/2017 [employer] Patient Education  2020 [employer]  

## 2019-11-09 NOTE — Progress Notes (Signed)
Subjective:    Patient ID: Kevin Decker, male    DOB: 17-Aug-1981, 39 y.o.   MRN: [id number] [employer] veteran who was discharged from medical issues who complains of chronic low back pain.  He states that his initial medical issues were related to meniscal tears in the knee but these improved after 2 arthroscopic partial meniscectomies.  He no longer has significant knee pain. The patient feels like his back pain has not been addressed to the degree that his knee pain has been.  He does not want to pursue surgery for his back but likes to see if there is any other treatment modalities and also have a clear picture of his diagnosis. He has tried chiropractic treatment which give some short-term relief.  He is fond of the TENS unit which she has at home as well but tries not to overuse it. In addition he has downloaded a core strengthening application on his phone and has tried some of the exercises on this.  Extension exercises, i.e. Cobra or up dog type exercise feel better than flexion exercises.  He has 1 particular spot that bothers him the most on the right side of the low back and buttock right at the belt line.  The patient wishes to avoid oral medications.  He does use diclofenac gel quite liberally in several areas. His goal is to play with his 38-year-old son. 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  There is no pain that is radiating down the legs.  There is pain radiating into the upper buttock region.  No numbness or tingling in the legs.  No weakness noted.  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  He is retired Nature conservation officer Patient states that he has had no significant improvement in his low back pain following  physical therapy.  Pain Inventory Average Pain 8 Pain Right Now 6 My pain is constant and sharp  In the last 24 hours, has pain interfered with the following? General activity  5 Relation with others 5 Enjoyment of life 5 What TIME of day is your pain at its worst? morning Sleep (in general) Fair  Pain is worse with: unsure Pain improves with: heat/ice, therapy/exercise and TENS Relief from Meds: 5  Mobility walk without assistance how many minutes can you walk? unlimited ability to climb steps?  yes do you drive?  yes transfers alone Do you have any goals in this area?  no  Function employed # of hrs/week 50-60 what is your job? [employer] Do you have any goals in this area?  no  Neuro/Psych depression  Prior Studies Any changes since last visit?  no  Physicians involved in your care Any changes since last visit?  no   History reviewed. No pertinent family history. Social History   Socioeconomic History  . Marital status: Single    Spouse name: Not on file  . Number of children: Not on file  . Years of education: Not on file  . Highest education level: Not on file  Occupational History  . Not on file  Tobacco Use  . Smoking status: Never Smoker  . Smokeless tobacco: Never Used  Substance and Sexual Activity  . Alcohol use: Yes    Comment: social  . Drug use: No  . Sexual activity: Not on file  Other Topics Concern  . Not on file  Social History  Narrative  . Not on file   Social Determinants of Health   Financial Resource Strain:   . Difficulty of Paying Living Expenses: Not on file  Food Insecurity:   . Worried About Programme researcher, broadcasting/film/video in the Last Year: Not on file  . Ran Out of Food in the Last Year: Not on file  Transportation Needs:   . Lack of Transportation (Medical): Not on file  . Lack of Transportation (Non-Medical): Not on file  Physical Activity:   . Days of Exercise per Week: Not on file  . Minutes of Exercise per Session: Not on file  Stress:   . Feeling of Stress : Not on file  Social Connections:   . Frequency of Communication with Friends and Family: Not on file  . Frequency of Social Gatherings with  Friends and Family: Not on file  . Attends Religious Services: Not on file  . Active Member of Clubs or Organizations: Not on file  . Attends Banker Meetings: Not on file  . Marital Status: Not on file   Past Surgical History:  Procedure Laterality Date  . APPENDECTOMY    . KNEE ARTHROSCOPY Left 11/04/2016   Procedure: ARTHROSCOPY KNEE, Partial MEdial and Lateral Meniscectomy;  Surgeon: Donato Heinz, MD;  Location: [facility] ORS;  Service: Orthopedics;  Laterality: Left;  . ORIF ULNAR FRACTURE Right 01/10/2016   Procedure: OPEN REDUCTION INTERNAL FIXATION (ORIF) ULNAR/RADIUS FRACTURE;  Surgeon: Donato Heinz, MD;  Location: [facility] ORS;  Service: Orthopedics;  Laterality: Right;  . torn meniscus Left   . WRIST FRACTURE SURGERY Right    Past Medical History:  Diagnosis Date  . Medical history non-contributory   . Pulmonary embolism (HCC)    There were no vitals taken for this visit.  Opioid Risk Score:   Fall Risk Score:  `1  Depression screen PHQ 2/9  No flowsheet data found.  Review of Systems  Constitutional: Negative.   HENT: Negative.   Eyes: Negative.   Respiratory: Negative.   Cardiovascular: Negative.   Gastrointestinal: Negative.   Endocrine: Negative.   Genitourinary: Negative.   Musculoskeletal: Positive for back pain.  Allergic/Immunologic: Negative.   Neurological: Negative.   Hematological: Negative.   Psychiatric/Behavioral: Positive for dysphoric mood.  All other systems reviewed and are negative.      Objective:   Physical Exam Vitals and nursing note reviewed.  Constitutional:      Appearance: Normal appearance.  Eyes:     Extraocular Movements: Extraocular movements intact.     Conjunctiva/sclera: Conjunctivae normal.     Pupils: Pupils are equal, round, and reactive to light.  Musculoskeletal:     Right hip: No tenderness. Normal range of motion. Normal strength.     Comments: Negative Gaenslen's, negative thrust test negative iliac  compression test, Faber's negative  Neurological:     General: No focal deficit present.     Mental Status: He is alert and oriented to person, place, and time. Mental status is at baseline.     Motor: No weakness.     Coordination: Coordination normal.     Gait: Gait normal.     Comments: Motor strength is 5/5 bilateral hip flexors knee extensors ankle dorsiflexors  Psychiatric:        Mood and Affect: Mood normal.        Behavior: Behavior normal.    Lumbar spine range of motion is normal Patient has pain with forward flexion relief with extension also relief with laying  in supine position and crossing right knee over the left thigh in a flexed hip position. Negative straight leg raising bilaterally       Assessment & Plan:  #1.  Chronic low back pain no radicular component.  He does have at least one sacroiliac test positive.  His directional relief is with extension which also suggest discogenic pain.  We discussed that his differential includes both discogenic pain as well as sacroiliac pain.  He has failed conservative care including lumbar stabilization exercises he does keep up with stretching as well as some basic stabilization exercises at home.  We will schedule for right sacroiliac injection under fluoroscopic guidance If this fails to relieve his pain I would recommend checking MRI of the lumbar spine

## 2019-11-23 ENCOUNTER — Encounter
Payer: No Typology Code available for payment source | Attending: Physical Medicine & Rehabilitation | Admitting: Physical Medicine & Rehabilitation

## 2019-11-23 ENCOUNTER — Encounter: Payer: Self-pay | Admitting: Physical Medicine & Rehabilitation

## 2019-11-23 ENCOUNTER — Other Ambulatory Visit: Payer: Self-pay

## 2019-11-23 VITALS — BP 134/69 | HR 69 | Temp 97.7°F | Ht 71.0 in | Wt 220.0 lb

## 2019-11-23 DIAGNOSIS — M5136 Other intervertebral disc degeneration, lumbar region: Secondary | ICD-10-CM | POA: Insufficient documentation

## 2019-11-23 DIAGNOSIS — M533 Sacrococcygeal disorders, not elsewhere classified: Secondary | ICD-10-CM | POA: Insufficient documentation

## 2019-11-23 NOTE — Patient Instructions (Signed)
Sacroiliac injection was performed today. A combination of a naming medicine plus a cortisone medicine was injected. The injection was done under x-ray guidance. This procedure has been performed to help reduce low back and buttocks pain as well as potentially hip pain. The duration of this injection is variable lasting from hours to  Months. It may repeated if needed. 

## 2019-11-23 NOTE — Progress Notes (Signed)

## 2019-11-23 NOTE — Progress Notes (Signed)
  PROCEDURE RECORD [facility]   Name: Argenis Kumari DOB:December 18, 1980 MRN: [id number]  Date:11/23/2019  Physician: Claudette Laws, MD    Nurse/CMA: Madelynne Lasker, CMA  Allergies: No Known Allergies  Consent Signed: Yes.    Is patient diabetic? No.  CBG today?   Pregnant: No. LMP: No LMP for male patient. (age 37-55)  Anticoagulants: no Anti-inflammatory: no Antibiotics: no  Procedure: right sacroiliac steroid injection  Position: Prone Start Time: 10:12am  End Time: 10:15am  Fluoro Time: 12s  RN/CMA Lenon Kuennen, CMA Alivya Wegman, CMA    Time 9:50AM 10:20am    BP 134/69 135/80    Pulse 69 82    Respirations 14 14    O2 Sat 98 097    S/S 6 6    Pain Level 7/10 0/10     D/C home with self, patient A & O X 3, D/C instructions reviewed, and sits independently.

## 2019-12-23 ENCOUNTER — Encounter
Payer: No Typology Code available for payment source | Attending: Physical Medicine & Rehabilitation | Admitting: Physical Medicine & Rehabilitation

## 2019-12-23 DIAGNOSIS — M5136 Other intervertebral disc degeneration, lumbar region: Secondary | ICD-10-CM | POA: Insufficient documentation

## 2019-12-23 DIAGNOSIS — M533 Sacrococcygeal disorders, not elsewhere classified: Secondary | ICD-10-CM | POA: Insufficient documentation

## 2019-12-24 ENCOUNTER — Other Ambulatory Visit: Payer: Self-pay

## 2019-12-24 ENCOUNTER — Encounter: Payer: Self-pay | Admitting: Physical Medicine & Rehabilitation

## 2019-12-24 ENCOUNTER — Encounter (HOSPITAL_BASED_OUTPATIENT_CLINIC_OR_DEPARTMENT_OTHER): Payer: No Typology Code available for payment source | Admitting: Physical Medicine & Rehabilitation

## 2019-12-24 VITALS — BP 116/72 | HR 85 | Temp 97.7°F | Ht 73.0 in | Wt 215.0 lb

## 2019-12-24 DIAGNOSIS — M5136 Other intervertebral disc degeneration, lumbar region: Secondary | ICD-10-CM | POA: Diagnosis present

## 2019-12-24 DIAGNOSIS — M533 Sacrococcygeal disorders, not elsewhere classified: Secondary | ICD-10-CM | POA: Diagnosis not present

## 2019-12-24 NOTE — Patient Instructions (Signed)
Please call if you wish to schedule Right sacroiliac nerve block

## 2019-12-24 NOTE — Progress Notes (Signed)
Subjective:    Patient ID: Kevin Decker, male    DOB: 1981-02-02, 39 y.o.   MRN: [id number]  HPI 39 year old male with chronic right-sided low back and buttock pain.  The patient has no numbness or tingling in his right foot.  He is independent with all self-care and mobility.  He is able to work out with [employer].  He has had chiropractic treatment for his right low back and buttock pain which was of partial relief.  He also noted relief with physical therapy and attended 15 visits. [facility] report[facility] given that he had benefit. In February 2021 patient underwent right sacroiliac injection under fluoroscopic guidance.  Preinjection pain level was 7/10 postinjection pain level is 0/10 Pain Inventory Average Pain 7 Pain Right Now 7 My pain is aching  In the last 24 hours, has pain interfered with the following? General activity 0 Relation with others 0 Enjoyment of life 0 What TIME of day is your pain at its worst? morning Sleep (in general) Fair  Pain is worse with: unsure Pain improves with: heat/ice, therapy/exercise, TENS and injections Relief from Meds: 0  Mobility walk without assistance ability to climb steps?  yes do you drive?  yes  Function employed # of hrs/week 40  Neuro/Psych No problems in this area  Prior Studies Any changes since last visit?  no  Physicians involved in your care Any changes since last visit?  no   No family history on file. Social History   Socioeconomic History  . Marital status: Single    Spouse name: Not on file  . Number of children: Not on file  . Years of education: Not on file  . Highest education level: Not on file  Occupational History  . Not on file  Tobacco Use  . Smoking status: Never Smoker  . Smokeless tobacco: Never Used  Substance and Sexual Activity  . Alcohol use: Yes    Comment: social  . Drug use: No  . Sexual activity: Not on file  Other  Topics Concern  . Not on file  Social History Narrative  . Not on file   Social Determinants of Health   Financial Resource Strain:   . Difficulty of Paying Living Expenses:   Food Insecurity:   . Worried About Programme researcher, broadcasting/film/video in the Last Year:   . Barista in the Last Year:   Transportation Needs:   . Freight forwarder (Medical):   Marland Kitchen Lack of Transportation (Non-Medical):   Physical Activity:   . Days of Exercise per Week:   . Minutes of Exercise per Session:   Stress:   . Feeling of Stress :   Social Connections:   . Frequency of Communication with Friends and Family:   . Frequency of Social Gatherings with Friends and Family:   . Attends Religious Services:   . Active Member of Clubs or Organizations:   . Attends Banker Meetings:   Marland Kitchen Marital Status:    Past Surgical History:  Procedure Laterality Date  . APPENDECTOMY    . KNEE ARTHROSCOPY Left 11/04/2016   Procedure: ARTHROSCOPY KNEE, Partial MEdial and Lateral Meniscectomy;  Surgeon: Donato Heinz, MD;  Location: [facility] ORS;  Service: Orthopedics;  Laterality: Left;  . ORIF ULNAR FRACTURE Right 01/10/2016   Procedure: OPEN REDUCTION INTERNAL FIXATION (ORIF) ULNAR/RADIUS FRACTURE;  Surgeon: Donato Heinz, MD;  Location: [facility] ORS;  Service: Orthopedics;  Laterality: Right;  . torn meniscus Left   . WRIST FRACTURE SURGERY Right    Past Medical History:  Diagnosis Date  . Medical history non-contributory   . Pulmonary embolism (HCC)    BP 116/72   Pulse 85   Temp 97.7 F (36.5 C)   Ht 6\' 1"  (1.854 m)   Wt 215 lb (97.5 kg)   SpO2 97%   BMI 28.37 kg/m   Opioid Risk Score:   Fall Risk Score:  `1  Depression screen PHQ 2/9  No flowsheet data found.  Review of Systems  Constitutional: Negative.   HENT: Negative.   Eyes: Negative.   Respiratory: Negative.   Cardiovascular: Negative.   Gastrointestinal: Negative.   Endocrine: Negative.   Genitourinary: Negative.    Musculoskeletal: Positive for arthralgias and back pain.  Skin: Negative.   Allergic/Immunologic: Negative.   Neurological: Negative.   Hematological: Negative.   Psychiatric/Behavioral: Negative.   All other systems reviewed and are negative.      Objective:   Physical Exam Vitals and nursing note reviewed.  Constitutional:      Appearance: Normal appearance.  HENT:     Head: Normocephalic and atraumatic.  Eyes:     Extraocular Movements: Extraocular movements intact.     Pupils: Pupils are equal, round, and reactive to light.  Musculoskeletal:     Comments: There is tenderness over the right PSIS area, no tenderness over the lumbar paraspinal area. Patient with limited lumbar flexion lumbar extension is normal, as is lateral bending  Skin:    General: Skin is warm and dry.  Neurological:     General: No focal deficit present.     Mental Status: He is alert and oriented to person, place, and time.     Comments: Negative straight leg raising bilaterally Motor strength is 5/5 bilateral hip flexor knee extensor ankle dorsiflexor   Psychiatric:        Mood and Affect: Mood normal.        Behavior: Behavior normal.           Assessment & Plan:  #1.  Chronic right-sided low back pain and buttock pain 100% relief for 9 to 10 days after sacroiliac fluoroscopically guided injection.  Patient also has been progressing with physical therapy and would like to continue this I think that is appropriate. I have sent a message to physical therapy to forward their notes to the [facility] in [place], Dr. Domenica Fail We discussed other treatment options including radiofrequency neurotomy of the nerves going to the right sacroiliac joint namely L5 dorsal ramus S1-S2-S3 lateral branches.  We discussed that there would first be a temporary nerve block to assess the efficacy prior to the radiofrequency neurotomy. Will call if he wishes to pursue this treatment option.  Follow-up as needed

## 2020-01-06 ENCOUNTER — Other Ambulatory Visit: Payer: Self-pay

## 2020-01-06 ENCOUNTER — Ambulatory Visit
Payer: No Typology Code available for payment source | Attending: Physical Medicine & Rehabilitation | Admitting: Physical Therapy

## 2020-01-06 DIAGNOSIS — M545 Low back pain: Secondary | ICD-10-CM | POA: Diagnosis present

## 2020-01-06 DIAGNOSIS — M62830 Muscle spasm of back: Secondary | ICD-10-CM

## 2020-01-06 DIAGNOSIS — G8929 Other chronic pain: Secondary | ICD-10-CM | POA: Diagnosis present

## 2020-01-06 NOTE — Therapy (Signed)
[place], [place], [address] Phone: [phone]   Fax:  [phone]  Physical Therapy Treatment  Patient Details  Name: Kevin Decker MRN: [id number] Date of Birth: 1981/02/10 Referring Provider (PT): Kirsteins   Encounter Date: 01/06/2020  PT End of Session - 01/06/20 1046    Visit Number  8    PT Start Time  1020    PT Stop Time  1110    PT Time Calculation (min)  50 min       Past Medical History:  Diagnosis Date  . Medical history non-contributory   . Pulmonary embolism [facility])     Past Surgical History:  Procedure Laterality Date  . APPENDECTOMY    . KNEE ARTHROSCOPY Left 11/04/2016   Procedure: ARTHROSCOPY KNEE, Partial MEdial and Lateral Meniscectomy;  Surgeon: Dereck Leep, MD;  Location: [facility] ORS;  Service: Orthopedics;  Laterality: Left;  . ORIF ULNAR FRACTURE Right 01/10/2016   Procedure: OPEN REDUCTION INTERNAL FIXATION (ORIF) ULNAR/RADIUS FRACTURE;  Surgeon: Dereck Leep, MD;  Location: [facility] ORS;  Service: Orthopedics;  Laterality: Right;  . torn meniscus Left   . WRIST FRACTURE SURGERY Right     There were no vitals filed for this visit.  Subjective Assessment - 01/06/20 1025    Subjective  pt reports 8 days painfree after SU joint injection then pain back. saw pain mang MD and verb that PT really helped while coming to therapy so they ordered more thru [place]. using TENS/ice/MH    Currently in Pain?  Yes    Pain Score  6     Pain Location  Back    Pain Orientation  Right;Lower         [facility] PT Assessment - 01/06/20 0001      AROM   Overall AROM Comments  Lumbar ROM is decreased 50% with c/o stiffness and some pain      Strength   Overall Strength Comments  4/5 for the LE's except hip flexor 4-/5      Flexibility   Hamstrings  tight    Piriformis  tight      Palpation   Palpation comment  tight and tender in the lumbar area, has a knot in the right SI area                    [facility] Adult PT Treatment/Exercise - 01/06/20 0001      Lumbar Exercises: Aerobic   Elliptical  I 13 R 7 2 min each way    Recumbent Bike  L 5 27mn      Lumbar Exercises: Machines for Strengthening   Cybex Lumbar Extension  black band 2x15    Other Lumbar Machine Exercise  seated row 45#,  lat pulls 45# 2x15    Other Lumbar Machine Exercise  BIL hip 4 way with pulley 15 each   15#     Lumbar Exercises: Supine   Bridge with Ball Squeeze  Compliant;15 reps;3 seconds    Bridge with clamshell  Compliant;15 reps;3 seconds   tband   Single Leg Bridge  Compliant;10 reps;3 seconds      Modalities   Modalities  Iontophoresis      Iontophoresis   Type of Iontophoresis  Dexamethasone    Location  RT SI    Dose  1.2 cc dex 80 mA patch    Time  4-6 hours  PT Short Term Goals - 09/29/19 0804      PT SHORT TERM GOAL #1   Title  independent with initial HEP    Status  Achieved        PT Long Term Goals - 01/06/20 1038      PT LONG TERM GOAL #1   Title  understand posture and body mechanics    Status  Achieved      PT LONG TERM GOAL #2   Title  increase lumbar ROM to WNL's    Status  Not Met      PT LONG TERM GOAL #3   Title  decrease pain 50% in th eAM    Status  Not Met      PT LONG TERM GOAL #4   Title  lift child without difficulty    Status  Not Met            Plan - 01/06/20 1038    Clinical Impression Statement  pt returns after not being here for a coupe monthes. temporary relief from SI injection but then returned. pt felt that he did get good relief from PT when he was coming last session. pt an hour late for appt so slow return to ex today and added ionto to see if helps since isolated to RT SI. Pt with TENS at home.    PT Treatment/Interventions  ADLs/Self Care Home Management;Electrical Stimulation;Moist Heat;Traction;Cryotherapy;Ultrasound;Therapeutic activities;Therapeutic exercise;Neuromuscular  re-education;Manual techniques;Patient/family education    PT Next Visit Plan  core stability and flexibility , ,may add lumbar traction. Asses how ionto felt       Patient will benefit from skilled therapeutic intervention in order to improve the following deficits and impairments:  Improper body mechanics, Pain, Postural dysfunction, Increased muscle spasms, Decreased range of motion, Decreased strength, Impaired flexibility  Visit Diagnosis: Muscle spasm of back  Chronic right-sided low back pain without sciatica     Problem List Patient Active Problem List   Diagnosis Date Noted  . Right forearm fracture 01/10/2016    Kevin Decker,ANGIE PTA 01/06/2020, 10:48 AM  [place], [place], [address] Phone: [phone]   Fax:  [phone]  Name: Kevin Decker MRN: [id number] Date of Birth: Mar 07, 1981

## 2020-01-18 ENCOUNTER — Ambulatory Visit
Payer: No Typology Code available for payment source | Attending: Physical Medicine & Rehabilitation | Admitting: Physical Therapy

## 2020-01-18 DIAGNOSIS — M62830 Muscle spasm of back: Secondary | ICD-10-CM | POA: Insufficient documentation

## 2020-01-18 DIAGNOSIS — G8929 Other chronic pain: Secondary | ICD-10-CM | POA: Insufficient documentation

## 2020-01-18 DIAGNOSIS — M545 Low back pain: Secondary | ICD-10-CM | POA: Insufficient documentation

## 2020-01-20 ENCOUNTER — Encounter: Payer: Self-pay | Admitting: Physical Therapy

## 2020-01-20 ENCOUNTER — Other Ambulatory Visit: Payer: Self-pay

## 2020-01-20 ENCOUNTER — Ambulatory Visit: Payer: No Typology Code available for payment source | Admitting: Physical Therapy

## 2020-01-20 DIAGNOSIS — M62830 Muscle spasm of back: Secondary | ICD-10-CM

## 2020-01-20 DIAGNOSIS — G8929 Other chronic pain: Secondary | ICD-10-CM | POA: Diagnosis present

## 2020-01-20 DIAGNOSIS — M545 Low back pain: Secondary | ICD-10-CM | POA: Diagnosis present

## 2020-01-20 NOTE — Therapy (Signed)
[place], [place], [address] Phone: [phone]   Fax:  [phone]  Physical Therapy Treatment  Patient Details  Name: Kevin Decker MRN: [id number] Date of Birth: [birth year] Referring Provider (PT): Kirsteins   Encounter Date: 01/20/2020  PT End of Session - 01/20/20 0842    Visit Number  9    Date for PT Re-Evaluation  11/13/19    PT Start Time  0800    PT Stop Time  0843    PT Time Calculation (min)  43 min       Past Medical History:  Diagnosis Date  . Medical history non-contributory   . Pulmonary embolism [facility])     Past Surgical History:  Procedure Laterality Date  . APPENDECTOMY    . KNEE ARTHROSCOPY Left 11/04/2016   Procedure: ARTHROSCOPY KNEE, Partial MEdial and Lateral Meniscectomy;  Surgeon: Dereck Leep, MD;  Location: [facility] ORS;  Service: Orthopedics;  Laterality: Left;  . ORIF ULNAR FRACTURE Right 01/10/2016   Procedure: OPEN REDUCTION INTERNAL FIXATION (ORIF) ULNAR/RADIUS FRACTURE;  Surgeon: Dereck Leep, MD;  Location: [facility] ORS;  Service: Orthopedics;  Laterality: Right;  . torn meniscus Left   . WRIST FRACTURE SURGERY Right     There were no vitals filed for this visit.  Subjective Assessment - 01/20/20 0759    Subjective  "Same old, same old, not as bad, went to the gym yesterday "    Currently in Pain?  Yes    Pain Score  7     Pain Location  Back    Pain Orientation  Right                       [facility] Adult PT Treatment/Exercise - 01/20/20 0001      Lumbar Exercises: Aerobic   Recumbent Bike  L 3 3 min    Nustep  level 5 x 6 minutes      Lumbar Exercises: Machines for Strengthening   Cybex Lumbar Extension  black band 2x15    Leg Press  50# 2 x 15    Other Lumbar Machine Exercise  seated row 45#,  lat pulls 45# 2x15    Other Lumbar Machine Exercise  Bilat hip Ext and abd 10lb x 15 eachj      Lumbar Exercises: Standing   Other  Standing Lumbar Exercises  Squat and row 65lb 2x15       Lumbar Exercises: Supine   Bridge  15 reps;2 seconds;Compliant      Iontophoresis   Type of Iontophoresis  Dexamethasone    Location  RT SI    Dose  1.2 cc dex 80 mA patch    Time  4-6 hours               PT Short Term Goals - 09/29/19 0804      PT SHORT TERM GOAL #1   Title  independent with initial HEP    Status  Achieved        PT Long Term Goals - 01/06/20 1038      PT LONG TERM GOAL #1   Title  understand posture and body mechanics    Status  Achieved      PT LONG TERM GOAL #2   Title  increase lumbar ROM to WNL's    Status  Not Met      PT LONG TERM GOAL #3   Title  decrease  pain 50% in th eAM    Status  Not Met      PT LONG TERM GOAL #4   Title  lift child without difficulty    Status  Not Met            Plan - 01/20/20 0843    Clinical Impression Statement  Pt did well with a progressed session. Increase fatigue noted with squat and rows. Tactile cues to prevent postural compensation with standing hip exercises. Reports going to the gym to get his back stronger. Continues with ionto treatment.    Stability/Clinical Decision Making  Stable/Uncomplicated    Rehab Potential  Good    PT Frequency  2x / week    PT Duration  8 weeks    PT Treatment/Interventions  ADLs/Self Care Home Management;Electrical Stimulation;Moist Heat;Traction;Cryotherapy;Ultrasound;Therapeutic activities;Therapeutic exercise;Neuromuscular re-education;Manual techniques;Patient/family education       Patient will benefit from skilled therapeutic intervention in order to improve the following deficits and impairments:  Improper body mechanics, Pain, Postural dysfunction, Increased muscle spasms, Decreased range of motion, Decreased strength, Impaired flexibility  Visit Diagnosis: Muscle spasm of back  Chronic right-sided low back pain without sciatica     Problem List Patient Active Problem List   Diagnosis  Date Noted  . Right forearm fracture 01/10/2016    Scot Jun, PTA 01/20/2020, 8:45 AM  [place], [place], [address] Phone: [phone]   Fax:  [phone]  Name: Kevin Decker MRN: [id number] Date of Birth: March 22, 1981

## 2020-01-24 ENCOUNTER — Encounter: Payer: No Typology Code available for payment source | Admitting: Physical Therapy

## 2020-01-31 ENCOUNTER — Other Ambulatory Visit: Payer: Self-pay

## 2020-01-31 ENCOUNTER — Ambulatory Visit: Payer: No Typology Code available for payment source | Admitting: Physical Therapy

## 2020-01-31 ENCOUNTER — Encounter: Payer: Self-pay | Admitting: Physical Therapy

## 2020-01-31 DIAGNOSIS — M62830 Muscle spasm of back: Secondary | ICD-10-CM | POA: Diagnosis not present

## 2020-01-31 DIAGNOSIS — M545 Low back pain, unspecified: Secondary | ICD-10-CM

## 2020-01-31 DIAGNOSIS — G8929 Other chronic pain: Secondary | ICD-10-CM

## 2020-01-31 NOTE — Therapy (Signed)
[address] [place], [place], [address] Phone: [phone]   Fax:  [phone]  Physical Therapy Treatment  Patient Details  Name: Kevin Decker MRN: [id number] Date of Birth: [birth year] Referring Provider (PT): Kirsteins   Encounter Date: 01/31/2020  PT End of Session - 01/31/20 6440    Visit Number  10    PT Start Time  3474    PT Stop Time  1555    PT Time Calculation (min)  40 min    Activity Tolerance  Patient tolerated treatment well    Behavior During Therapy  [facility] for tasks assessed/performed       Past Medical History:  Diagnosis Date  . Medical history non-contributory   . Pulmonary embolism [facility])     Past Surgical History:  Procedure Laterality Date  . APPENDECTOMY    . KNEE ARTHROSCOPY Left 11/04/2016   Procedure: ARTHROSCOPY KNEE, Partial MEdial and Lateral Meniscectomy;  Surgeon: Dereck Leep, MD;  Location: [facility] ORS;  Service: Orthopedics;  Laterality: Left;  . ORIF ULNAR FRACTURE Right 01/10/2016   Procedure: OPEN REDUCTION INTERNAL FIXATION (ORIF) ULNAR/RADIUS FRACTURE;  Surgeon: Dereck Leep, MD;  Location: [facility] ORS;  Service: Orthopedics;  Laterality: Right;  . torn meniscus Left   . WRIST FRACTURE SURGERY Right     There were no vitals filed for this visit.  Subjective Assessment - 01/31/20 1511    Subjective  "Same typical pain in my lower R side"    Currently in Pain?  Yes    Pain Score  7     Pain Location  Back    Pain Orientation  Right;Lower                       [facility] Adult PT Treatment/Exercise - 01/31/20 0001      Lumbar Exercises: Aerobic   Elliptical  I 13 R 7 2 min each way    Nustep  level 5 x 6 minutes      Lumbar Exercises: Machines for Strengthening   Other Lumbar Machine Exercise  seated row 55#,  lat pulls 5# 2x15      Lumbar Exercises: Standing   Shoulder Extension  Theraband;20 reps;Both    Shoulder Extension Limitations   20    Other Standing Lumbar Exercises  Squat and row 65lb 2x15     Other Standing Lumbar Exercises  step ups on mat table x10 each       Lumbar Exercises: Supine   Bridge  Compliant;2 seconds    Other Supine Lumbar Exercises  SL bridge x10 each     Other Supine Lumbar Exercises  LE on pball bridges, K2X, Oblq                PT Short Term Goals - 09/29/19 2595      PT SHORT TERM GOAL #1   Title  independent with initial HEP    Status  Achieved        PT Long Term Goals - 01/31/20 1558      PT LONG TERM GOAL #1   Title  understand posture and body mechanics    Status  Achieved      PT LONG TERM GOAL #2   Title  increase lumbar ROM to WNL's    Status  Partially Met      PT LONG TERM GOAL #3   Title  decrease pain 50% in th eAM  Status  On-going      PT LONG TERM GOAL #4   Title  lift child without difficulty    Status  Partially Met            Plan - 01/31/20 1556    Clinical Impression Statement  Pt reported a fall a week ago going down steps, no injuries reported. He did report some increase tightness with the supine exercises cause of the fall. LE fatigue present with step ups on mat table.Tactile cues needed for posture with seated rows.    Stability/Clinical Decision Making  Stable/Uncomplicated    Rehab Potential  Good    PT Frequency  2x / week    PT Duration  8 weeks    PT Treatment/Interventions  ADLs/Self Care Home Management;Electrical Stimulation;Moist Heat;Traction;Cryotherapy;Ultrasound;Therapeutic activities;Therapeutic exercise;Neuromuscular re-education;Manual techniques;Patient/family education    PT Next Visit Plan  core stability and flexibility , ,may add lumbar traction       Patient will benefit from skilled therapeutic intervention in order to improve the following deficits and impairments:  Improper body mechanics, Pain, Postural dysfunction, Increased muscle spasms, Decreased range of motion, Decreased strength, Impaired  flexibility  Visit Diagnosis: Muscle spasm of back  Chronic right-sided low back pain without sciatica     Problem List Patient Active Problem List   Diagnosis Date Noted  . Right forearm fracture 01/10/2016    Kevin Decker, PTA 01/31/2020, 3:58 PM  [place] [place], [place], [address] Phone: [phone]   Fax:  [phone]  Name: Kevin Decker MRN: [id number] Date of Birth: 1981/09/04

## 2023-02-06 ENCOUNTER — Emergency Department (HOSPITAL_COMMUNITY): Payer: No Typology Code available for payment source

## 2023-02-06 ENCOUNTER — Other Ambulatory Visit: Payer: Self-pay

## 2023-02-06 ENCOUNTER — Emergency Department (HOSPITAL_COMMUNITY)
Admission: EM | Admit: 2023-02-06 | Discharge: 2023-02-06 | Disposition: A | Discharge status: Home / Self Care | Attending: Emergency Medicine | Admitting: Emergency Medicine

## 2023-02-06 ENCOUNTER — Encounter (HOSPITAL_COMMUNITY): Payer: Self-pay

## 2023-02-06 DIAGNOSIS — S6991XA Unspecified injury of right wrist, hand and finger(s), initial encounter: Secondary | ICD-10-CM | POA: Diagnosis present

## 2023-02-06 DIAGNOSIS — Y990 Civilian activity done for income or pay: Secondary | ICD-10-CM | POA: Diagnosis not present

## 2023-02-06 DIAGNOSIS — S52501A Unspecified fracture of the lower end of right radius, initial encounter for closed fracture: Secondary | ICD-10-CM | POA: Insufficient documentation

## 2023-02-06 DIAGNOSIS — W19XXXA Unspecified fall, initial encounter: Secondary | ICD-10-CM | POA: Diagnosis not present

## 2023-02-06 DIAGNOSIS — M7989 Other specified soft tissue disorders: Secondary | ICD-10-CM | POA: Diagnosis not present

## 2023-02-06 MED ORDER — HYDROCODONE-ACETAMINOPHEN 5-325 MG PO TABS: 1.0000 | ORAL_TABLET | ORAL | 0 refills | Status: DC | PRN

## 2023-02-06 NOTE — ED Provider Notes (Addendum)
[place] EMERGENCY DEPARTMENT AT [facility] Provider Note   CSN: [id number] Arrival date & time: 02/06/23  1527     History  Chief Complaint  Patient presents with   Arm Injury    Kevin Decker is a 42 y.o. male.  Patient reports that he fell today at work.  Patient reports that he landed on his right arm.  Patient reports that he has a swollen area.  Patient has a history of having had a fracture to his radius and ulna in 2020.  Patient reports he has a plate at the site of the fracture.  Patient went to an urgent care and was sent here for evaluation.  The history is provided by the patient. No language interpreter was used.  Arm Injury Location:  Arm Arm location:  R forearm Injury: yes   Pain details:    Quality:  Aching   Radiates to:  Does not radiate Prior injury to area:  Yes Relieved by:  Nothing      Home Medications Prior to Admission medications   Medication Sig Start Date End Date Taking? Authorizing Provider  diclofenac Sodium (VOLTAREN) 1 % GEL Apply topically 4 (four) times daily.    [provider]  Multiple Vitamin (MULTIVITAMIN WITH MINERALS) TABS tablet Take 2 tablets by mouth daily. Gummy Vita Fusion    [provider]      Allergies    Patient has no known allergies.    Review of Systems   Review of Systems  All other systems reviewed and are negative.   Physical Exam Updated Vital Signs BP (!) 139/106   Pulse 68   Temp 98 F (36.7 C) (Oral)   Resp 15   Ht  (1.854 m)   Wt 98.9 kg   SpO2 100%   BMI 28.76 kg/m  Physical Exam Vitals and nursing note reviewed.  Constitutional:      Appearance: He is well-developed.  HENT:     Head: Normocephalic.  Cardiovascular:     Rate and Rhythm: Normal rate.  Pulmonary:     Effort: Pulmonary effort is normal.  Abdominal:     General: There is no distension.  Musculoskeletal:        General: Swelling and tenderness present.     Cervical back:  Normal range of motion.     Comments: Swollen tender right lower forearm, full range of motion wrist and fingers patient has a good pulse sensation is intact  Skin:    General: Skin is warm.  Neurological:     General: No focal deficit present.     Mental Status: He is alert and oriented to person, place, and time.     ED Results / Procedures / Treatments   Labs (all labs ordered are listed, but only abnormal results are displayed) Labs Reviewed - No data to display  EKG None  Radiology DG Forearm Right  Result Date: 02/06/2023 CLINICAL DATA:  Pain after fall EXAM: RIGHT FOREARM - 2 VIEW COMPARISON:  01/10/2016 FINDINGS: There has been previous fixation plate and screws transfixing the midshaft of the radius and ulna. There is new displaced and apex dorsal angulated fracture of the distal radial mid diaphysis which is near the level of the distal margin of the previous fixation plate. Acute fracture. No additional fracture or dislocation. Preserved bone mineralization. IMPRESSION: Acute displaced and angulated fracture of the distal radial mid diaphysis. Fracture line is at the level of the distal margin of the  previous midshaft fixation plate and screws. Healed fracture deformity with fixation plate of the adjacent ulnar shaft as well Electronically Signed   By: Karen Kays M.D.   On: 02/06/2023 17:32    Procedures Procedures    Medications Ordered in ED Medications - No data to display  ED Course/ Medical Decision Making/ A&P                             Medical Decision Making Patient reports he fell at work onto his right arm.  Patient had a fracture from 2020 and has hardware in his right arm  Amount and/or Complexity of Data Reviewed External Data Reviewed: radiology.    Details: Patient has a single view x-ray on his phone which shows fracture Radiology: ordered and independent interpretation performed. Decision-making details documented in ED Course.    Details: X-ray  forearm ordered reviewed and interpreted.  X-ray shows a radius fracture through the distal end of bone at hardware site Discussion of management or test interpretation with external provider(s): I discussed the patient with Dr. Thurston Pounds who advised sugar tong.  He will call the pt with appointment time.  He will see pt in the office on Monday   Risk Prescription drug management. Risk Details: Patient advised ice elevation he is splinted placed in sling he is advised to follow-up with orthopedist on Monday as scheduled he should return if any problems           Final Clinical Impression(s) / ED Diagnoses Final diagnoses:  Closed fracture of distal end of right radius, unspecified fracture morphology, initial encounter    Rx / DC Orders ED Discharge Orders          Ordered    HYDROcodone-acetaminophen (NORCO/VICODIN) 5-325 MG tablet  Every 4 hours PRN        02/06/23 1845           An After Visit Summary was printed and given to the patient.    Elson Areas, PA-C 02/06/23 1845    Elson Areas, PA-C 02/06/23 1942    Arby Barrette, MD 02/11/23 [phone]

## 2023-02-06 NOTE — ED Triage Notes (Addendum)
Patient fell at work today. Tried to catch himself and landed on his right arm. Previous break to same arm in 2020 where a rod was placed. Able to wiggle fingers. No numbness or tingling.

## 2023-02-06 NOTE — ED Notes (Addendum)
Orthopedist wants sugar tong to go past wrist, but not to enclose the fingers

## 2023-02-10 ENCOUNTER — Other Ambulatory Visit: Payer: Self-pay | Admitting: Orthopedic Surgery

## 2023-02-14 NOTE — Progress Notes (Signed)
Surgical Instructions       Your procedure is scheduled on Friday May 10.  Report to [facility] Main Entrance "A" at 8 A.M., then check in with the Admitting office.  Call this number if you have problems the morning of surgery:  [phone]   If you have any questions prior to your surgery date call [phone]: Open Monday-Friday 8am-4pm If you experience any cold or flu symptoms such as cough, fever, chills, shortness of breath, etc. between now and your scheduled surgery, please notify us at the above number     Remember:  Do not eat after midnight the night before your surgery  You may drink clear liquids until 7am the morning of your surgery.   Clear liquids allowed are: Water, Non-Citrus Juices (without pulp), Carbonated Beverages, Clear Tea, Black Coffee ONLY (NO MILK, CREAM OR POWDERED CREAMER of any kind), and Gatorade   Enhanced Recovery after Surgery for Orthopedics Enhanced Recovery after Surgery is a protocol used to improve the stress on your body and your recovery after surgery.  Patient Instructions  The day of surgery (if you do NOT have diabetes):  Drink ONE (1) Pre-Surgery Clear Ensure by __7___ am the morning of surgery   This drink was given to you during your hospital  pre-op appointment visit. Nothing else to drink after completing the  Pre-Surgery Clear Ensure.          If you have questions, please contact your surgeon's office.    Take these medicines the morning of surgery with A SIP OF WATER:  buPROPion (WELLBUTRIN XL) 150 MG 24 hr tablet  HYDROcodone-acetaminophen (NORCO/VICODIN) 5-325 MG tablet  if needed  As of today, STOP taking any Aspirin (unless otherwise instructed by your surgeon) VOLTAREN, Aleve, Naproxen, Ibuprofen, Motrin, Advil, Goody's, BC's, all herbal medications, fish oil, and all vitamins.           Do not wear jewelry . Do not wear lotions, powders, cologne or deodorant. Do not shave 48 hours prior to surgery.  Men may shave  face and neck. Do not bring valuables to the hospital. Do not wear nail polish  [facility] is not responsible for any belongings or valuables.    Do NOT Smoke (Tobacco/Vaping)  24 hours prior to your procedure  If you use a CPAP at night, you may bring your mask for your overnight stay.   Contacts, glasses, hearing aids, dentures or partials may not be worn into surgery, please bring cases for these belongings   For patients admitted to the hospital, discharge time will be determined by your treatment team.   Patients discharged the day of surgery will not be allowed to drive home, and someone needs to stay with them for 24 hours.   SURGICAL WAITING ROOM VISITATION Patients having surgery or a procedure may have no more than 2 support people in the waiting area - these visitors may rotate.   Children under the age of 109 must have an adult with them who is not the patient. If the patient needs to stay at the hospital during part of their recovery, the visitor guidelines for inpatient rooms apply. Pre-op nurse will coordinate an appropriate time for 1 support person to accompany patient in pre-op.  This support person may not rotate.   Please refer to [email or web address] for the visitor guidelines for Inpatients (after your surgery is over and you are in a regular room).    Special instructions:    Oral Hygiene is also  important to reduce your risk of infection.  Remember - BRUSH YOUR TEETH THE MORNING OF SURGERY WITH YOUR REGULAR TOOTHPASTE   [facility]- Preparing For Surgery  Before surgery, you can play an important role. Because skin is not sterile, your skin needs to be as free of germs as possible. You can reduce the number of germs on your skin by washing with CHG (chlorahexidine gluconate) Soap before surgery.  CHG is an antiseptic cleaner which kills germs and bonds with the skin to continue killing germs even after washing.      Please do not use if you have an allergy to CHG or antibacterial soaps. If your skin becomes reddened/irritated stop using the CHG.  Do not shave (including legs and underarms) for at least 48 hours prior to first CHG shower. It is OK to shave your face.  Please follow these instructions carefully.     Shower the NIGHT BEFORE SURGERY and the MORNING OF SURGERY with CHG Soap.   If you chose to wash your hair, wash your hair first as usual with your normal shampoo. After you shampoo, rinse your hair and body thoroughly to remove the shampoo.  Then [employer] and genitals (private parts) with your normal soap and rinse thoroughly to remove soap.  After that Use CHG Soap as you would any other liquid soap. You can apply CHG directly to the skin and wash gently with a scrungie or a clean washcloth.   Apply the CHG Soap to your body ONLY FROM THE NECK DOWN.  Do not use on open wounds or open sores. Avoid contact with your eyes, ears, mouth and genitals (private parts). Wash Face and genitals (private parts)  with your normal soap.   Wash thoroughly, paying special attention to the area where your surgery will be performed.  Thoroughly rinse your body with warm water from the neck down.  DO NOT shower/wash with your normal soap after using and rinsing off the CHG Soap.  Pat yourself dry with a CLEAN TOWEL.  Wear CLEAN PAJAMAS to bed the night before surgery  Place CLEAN SHEETS on your bed the night before your surgery  DO NOT SLEEP WITH PETS.   Day of Surgery:  Take a shower with CHG soap. Wear Clean/Comfortable clothing the morning of surgery Do not apply any deodorants/lotions.   Remember to brush your teeth WITH YOUR REGULAR TOOTHPASTE.    If you received a COVID test during your pre-op visit, it is requested that you wear a mask when out in public, stay away from anyone that may not be feeling well, and notify your surgeon if you develop symptoms. If you have been in contact  with anyone that has tested positive in the last 10 days, please notify your surgeon.    Please read over the following fact sheets that you were given.

## 2023-02-17 ENCOUNTER — Other Ambulatory Visit: Payer: Self-pay

## 2023-02-17 ENCOUNTER — Encounter (HOSPITAL_COMMUNITY): Payer: Self-pay

## 2023-02-17 ENCOUNTER — Encounter (HOSPITAL_COMMUNITY)
Admission: RE | Admit: 2023-02-17 | Discharge: 2023-02-17 | Disposition: A | Discharge status: Home / Self Care | Source: Ambulatory Visit | Attending: Internal Medicine | Admitting: Internal Medicine

## 2023-02-17 VITALS — BP 123/84 | HR 78 | Temp 98.4°F | Resp 18 | Ht 73.0 in | Wt 233.7 lb

## 2023-02-17 DIAGNOSIS — Z01812 Encounter for preprocedural laboratory examination: Secondary | ICD-10-CM | POA: Insufficient documentation

## 2023-02-17 DIAGNOSIS — Z01818 Encounter for other preprocedural examination: Secondary | ICD-10-CM

## 2023-02-17 LAB — CBC
HCT: 39.1 % (ref 39.0–52.0)
Hemoglobin: 12.9 g/dL — ABNORMAL LOW (ref 13.0–17.0)
MCH: 26.9 pg (ref 26.0–34.0)
MCHC: 33 g/dL (ref 30.0–36.0)
MCV: 81.6 fL (ref 80.0–100.0)
Platelets: 191 10*3/uL (ref 150–400)
RBC: 4.79 MIL/uL (ref 4.22–5.81)
RDW: 14.4 % (ref 11.5–15.5)
WBC: 6.1 10*3/uL (ref 4.0–10.5)
nRBC: 0 % (ref 0.0–0.2)

## 2023-02-17 NOTE — Progress Notes (Signed)
PCP - Freada Bergeron, MD Cardiologist - Denies  PPM/ICD - Denies  Chest x-ray - Denies EKG - Denies Stress Test - Denies ECHO - Denies Cardiac Cath - Denies  Sleep Study - Denies  DM: Denies  Blood Thinner Instructions: N/A Aspirin Instructions: N/A  ERAS Protcol - Yes PRE-SURGERY Ensure or G2- Ensure  COVID TEST- N/A   Anesthesia review: No  Patient denies shortness of breath, fever, cough and chest pain at PAT appointment   All instructions explained to the patient, with a verbal understanding of the material. Patient agrees to go over the instructions while at home for a better understanding.The opportunity to ask questions was provided.

## 2023-02-20 NOTE — Anesthesia Preprocedure Evaluation (Addendum)
Anesthesia Evaluation  Patient identified by MRN, date of birth, ID band Patient awake    Reviewed: Allergy & Precautions, NPO status , Patient's Chart, lab work & pertinent test results  History of Anesthesia Complications Negative for: history of anesthetic complications  Airway Mallampati: III  TM Distance: >3 FB Neck ROM: Full    Dental  (+) Dental Advisory Given   Pulmonary neg shortness of breath, neg sleep apnea, neg COPD, neg recent URI, PE (2018 or 2019 after knee surgery)   Pulmonary exam normal breath sounds clear to auscultation       Cardiovascular negative cardio ROS  Rhythm:Regular Rate:Normal     Neuro/Psych negative neurological ROS     GI/Hepatic negative GI ROS, Neg liver ROS,,,  Endo/Other  negative endocrine ROS    Renal/GU negative Renal ROS     Musculoskeletal   Abdominal   Peds  Hematology negative hematology ROS (+)   Anesthesia Other Findings   Reproductive/Obstetrics                             Anesthesia Physical Anesthesia Plan  ASA: 2  Anesthesia Plan: Regional and General   Post-op Pain Management: Tylenol PO (pre-op)* and Regional block*   Induction: Intravenous  PONV Risk Score and Plan: 2 and Ondansetron, Dexamethasone and Treatment may vary due to age or medical condition  Airway Management Planned: LMA  Additional Equipment:   Intra-op Plan:   Post-operative Plan: Extubation in OR  Informed Consent: I have reviewed the patients History and Physical, chart, labs and discussed the procedure including the risks, benefits and alternatives for the proposed anesthesia with the patient or authorized representative who has indicated his/her understanding and acceptance.     Dental advisory given  Plan Discussed with: CRNA and Anesthesiologist  Anesthesia Plan Comments: (Discussed potential risks of nerve blocks including, but not limited to,  infection, bleeding, nerve damage, seizures, pneumothorax, respiratory depression, and potential failure of the block. Alternatives to nerve blocks discussed. All questions answered.  Risks of general anesthesia discussed including, but not limited to, sore throat, hoarse voice, chipped/damaged teeth, injury to vocal cords, nausea and vomiting, allergic reactions, lung infection, heart attack, stroke, and death. All questions answered.  )        Anesthesia Quick Evaluation

## 2023-02-21 ENCOUNTER — Ambulatory Visit (HOSPITAL_COMMUNITY): Payer: No Typology Code available for payment source

## 2023-02-21 ENCOUNTER — Encounter (HOSPITAL_COMMUNITY): Payer: Self-pay | Admitting: Student

## 2023-02-21 ENCOUNTER — Encounter (HOSPITAL_COMMUNITY)
Admission: RE | Disposition: A | Discharge status: Home / Self Care | Payer: Self-pay | Source: Home / Self Care | Attending: Orthopedic Surgery

## 2023-02-21 ENCOUNTER — Other Ambulatory Visit: Payer: Self-pay

## 2023-02-21 ENCOUNTER — Ambulatory Visit (HOSPITAL_COMMUNITY)
Admission: RE | Admit: 2023-02-21 | Discharge: 2023-02-21 | Disposition: A | Discharge status: Home / Self Care | Attending: Orthopedic Surgery | Admitting: Orthopedic Surgery

## 2023-02-21 ENCOUNTER — Ambulatory Visit (HOSPITAL_COMMUNITY): Admitting: Anesthesiology

## 2023-02-21 ENCOUNTER — Ambulatory Visit (HOSPITAL_BASED_OUTPATIENT_CLINIC_OR_DEPARTMENT_OTHER): Admitting: Anesthesiology

## 2023-02-21 ENCOUNTER — Encounter (HOSPITAL_COMMUNITY): Payer: Self-pay | Admitting: Orthopedic Surgery

## 2023-02-21 DIAGNOSIS — S52501A Unspecified fracture of the lower end of right radius, initial encounter for closed fracture: Secondary | ICD-10-CM | POA: Diagnosis present

## 2023-02-21 DIAGNOSIS — X58XXXA Exposure to other specified factors, initial encounter: Secondary | ICD-10-CM | POA: Insufficient documentation

## 2023-02-21 HISTORY — PX: OPEN REDUCTION INTERNAL FIXATION (ORIF) DISTAL RADIAL FRACTURE: SHX5989

## 2023-02-21 HISTORY — PX: MINOR HARDWARE REMOVAL: SHX6474

## 2023-02-21 SURGERY — OPEN REDUCTION INTERNAL FIXATION (ORIF) DISTAL RADIUS FRACTURE
Anesthesia: Regional | Site: Arm Lower | Laterality: Right

## 2023-02-21 MED ORDER — 0.9 % SODIUM CHLORIDE (POUR BTL) OPTIME
TOPICAL | Status: DC | PRN
  Administered 2023-02-21: 1000 mL

## 2023-02-21 MED ORDER — OXYCODONE HCL 5 MG PO TABS: 5.0000 mg | ORAL_TABLET | ORAL | 0 refills | Status: AC | PRN

## 2023-02-21 MED ORDER — LACTATED RINGERS IV SOLN: INTRAVENOUS | Status: DC

## 2023-02-21 MED ORDER — ACETAMINOPHEN 500 MG PO TABS
ORAL_TABLET | ORAL | Status: AC
  Filled 2023-02-21: qty 2

## 2023-02-21 MED ORDER — AMISULPRIDE (ANTIEMETIC) 5 MG/2ML IV SOLN: 10.0000 mg | Freq: Once | INTRAVENOUS | Status: DC | PRN

## 2023-02-21 MED ORDER — PROPOFOL 10 MG/ML IV BOLUS
INTRAVENOUS | Status: AC
  Filled 2023-02-21: qty 20

## 2023-02-21 MED ORDER — CEFAZOLIN SODIUM-DEXTROSE 2-4 GM/100ML-% IV SOLN
INTRAVENOUS | Status: AC
  Filled 2023-02-21: qty 100

## 2023-02-21 MED ORDER — LIDOCAINE 2% (20 MG/ML) 5 ML SYRINGE
INTRAMUSCULAR | Status: DC | PRN
  Administered 2023-02-21: 100 mg via INTRAVENOUS

## 2023-02-21 MED ORDER — OXYCODONE HCL 5 MG/5ML PO SOLN: 5.0000 mg | Freq: Once | ORAL | Status: DC | PRN

## 2023-02-21 MED ORDER — DEXAMETHASONE SODIUM PHOSPHATE 10 MG/ML IJ SOLN
INTRAMUSCULAR | Status: DC | PRN
  Administered 2023-02-21: 10 mg via INTRAVENOUS

## 2023-02-21 MED ORDER — ORAL CARE MOUTH RINSE: 15.0000 mL | Freq: Once | OROMUCOSAL | Status: AC

## 2023-02-21 MED ORDER — FENTANYL CITRATE (PF) 100 MCG/2ML IJ SOLN: 50.0000 ug | Freq: Once | INTRAMUSCULAR | Status: AC

## 2023-02-21 MED ORDER — MIDAZOLAM HCL 2 MG/2ML IJ SOLN
INTRAMUSCULAR | Status: AC
  Administered 2023-02-21: 2 mg
  Filled 2023-02-21: qty 2

## 2023-02-21 MED ORDER — MIDAZOLAM HCL 2 MG/2ML IJ SOLN
INTRAMUSCULAR | Status: DC | PRN
  Administered 2023-02-21: 2 mg via INTRAVENOUS

## 2023-02-21 MED ORDER — ONDANSETRON HCL 4 MG/2ML IJ SOLN
INTRAMUSCULAR | Status: DC | PRN
  Administered 2023-02-21: 4 mg via INTRAVENOUS

## 2023-02-21 MED ORDER — MIDAZOLAM HCL 2 MG/2ML IJ SOLN
INTRAMUSCULAR | Status: AC
  Filled 2023-02-21: qty 2

## 2023-02-21 MED ORDER — LIDOCAINE 2% (20 MG/ML) 5 ML SYRINGE
INTRAMUSCULAR | Status: AC
  Filled 2023-02-21: qty 5

## 2023-02-21 MED ORDER — DEXAMETHASONE SODIUM PHOSPHATE 10 MG/ML IJ SOLN
INTRAMUSCULAR | Status: AC
  Filled 2023-02-21: qty 1

## 2023-02-21 MED ORDER — ONDANSETRON HCL 4 MG/2ML IJ SOLN
INTRAMUSCULAR | Status: AC
  Filled 2023-02-21: qty 2

## 2023-02-21 MED ORDER — FENTANYL CITRATE (PF) 100 MCG/2ML IJ SOLN
INTRAMUSCULAR | Status: AC
  Administered 2023-02-21: 50 ug via INTRAVENOUS
  Filled 2023-02-21: qty 2

## 2023-02-21 MED ORDER — FENTANYL CITRATE (PF) 250 MCG/5ML IJ SOLN
INTRAMUSCULAR | Status: DC | PRN
  Administered 2023-02-21 (×2): 25 ug via INTRAVENOUS
  Administered 2023-02-21: 50 ug via INTRAVENOUS
  Administered 2023-02-21 (×2): 25 ug via INTRAVENOUS

## 2023-02-21 MED ORDER — PROPOFOL 10 MG/ML IV BOLUS
INTRAVENOUS | Status: DC | PRN
  Administered 2023-02-21: 200 mg via INTRAVENOUS

## 2023-02-21 MED ORDER — FENTANYL CITRATE (PF) 250 MCG/5ML IJ SOLN
INTRAMUSCULAR | Status: AC
  Filled 2023-02-21: qty 5

## 2023-02-21 MED ORDER — FENTANYL CITRATE (PF) 100 MCG/2ML IJ SOLN: 25.0000 ug | INTRAMUSCULAR | Status: DC | PRN

## 2023-02-21 MED ORDER — ACETAMINOPHEN 500 MG PO TABS
1000.0000 mg | ORAL_TABLET | Freq: Once | ORAL | Status: AC
  Administered 2023-02-21: 1000 mg via ORAL

## 2023-02-21 MED ORDER — OXYCODONE HCL 5 MG PO TABS: 5.0000 mg | ORAL_TABLET | Freq: Once | ORAL | Status: DC | PRN

## 2023-02-21 MED ORDER — CHLORHEXIDINE GLUCONATE 0.12 % MT SOLN: 15.0000 mL | Freq: Once | OROMUCOSAL | Status: AC

## 2023-02-21 MED ORDER — MIDAZOLAM HCL 2 MG/2ML IJ SOLN: 2.0000 mg | Freq: Once | INTRAMUSCULAR | Status: DC

## 2023-02-21 MED ORDER — CHLORHEXIDINE GLUCONATE 0.12 % MT SOLN
OROMUCOSAL | Status: AC
  Administered 2023-02-21: 15 mL via OROMUCOSAL
  Filled 2023-02-21: qty 15

## 2023-02-21 MED ORDER — CEFAZOLIN SODIUM-DEXTROSE 2-4 GM/100ML-% IV SOLN
2.0000 g | INTRAVENOUS | Status: AC
  Administered 2023-02-21: 2 g via INTRAVENOUS

## 2023-02-21 MED ORDER — ROPIVACAINE HCL 5 MG/ML IJ SOLN
INTRAMUSCULAR | Status: DC | PRN
  Administered 2023-02-21: 30 mL via PERINEURAL

## 2023-02-21 MED ORDER — EPHEDRINE SULFATE-NACL 50-0.9 MG/10ML-% IV SOSY
PREFILLED_SYRINGE | INTRAVENOUS | Status: DC | PRN
  Administered 2023-02-21 (×5): 5 mg via INTRAVENOUS

## 2023-02-21 SURGICAL SUPPLY — 68 items
BAG COUNTER SPONGE SURGICOUNT (BAG) ×1 IMPLANT
BAG SPNG CNTER NS LX DISP (BAG) ×1
BIT DRILL QC 2.5X135 (BIT) IMPLANT
BLADE CLIPPER SURG (BLADE) IMPLANT
BNDG CMPR 5X3 KNIT ELC UNQ LF (GAUZE/BANDAGES/DRESSINGS)
BNDG CMPR 9X4 STRL LF SNTH (GAUZE/BANDAGES/DRESSINGS) ×1
BNDG CMPR STD VLCR NS LF 5.8X4 (GAUZE/BANDAGES/DRESSINGS) ×3
BNDG ELASTIC 3INX 5YD STR LF (GAUZE/BANDAGES/DRESSINGS) ×1 IMPLANT
BNDG ELASTIC 4X5.8 VLCR NS LF (GAUZE/BANDAGES/DRESSINGS) IMPLANT
BNDG ELASTIC 4X5.8 VLCR STR LF (GAUZE/BANDAGES/DRESSINGS) ×1 IMPLANT
BNDG ESMARK 4X9 LF (GAUZE/BANDAGES/DRESSINGS) ×1 IMPLANT
BNDG GAUZE DERMACEA FLUFF 4 (GAUZE/BANDAGES/DRESSINGS) ×1 IMPLANT
BNDG GZE DERMACEA 4 6PLY (GAUZE/BANDAGES/DRESSINGS) ×1
CORD BIPOLAR FORCEPS 12FT (ELECTRODE) ×1 IMPLANT
COVER SURGICAL LIGHT HANDLE (MISCELLANEOUS) ×1 IMPLANT
CUFF TOURN SGL QUICK 18X4 (TOURNIQUET CUFF) ×1 IMPLANT
DRAIN TLS ROUND 10FR (DRAIN) IMPLANT
DRAPE SURG 17X23 STRL (DRAPES) ×1 IMPLANT
GAUZE SPONGE 4X4 12PLY STRL (GAUZE/BANDAGES/DRESSINGS) ×1 IMPLANT
GAUZE SPONGE 4X4 16PLY XRAY LF (GAUZE/BANDAGES/DRESSINGS) IMPLANT
GAUZE XEROFORM 1X8 LF (GAUZE/BANDAGES/DRESSINGS) ×1 IMPLANT
GAUZE XEROFORM 5X9 LF (GAUZE/BANDAGES/DRESSINGS) IMPLANT
GLOVE BIOGEL M 8.0 STRL (GLOVE) ×1 IMPLANT
GLOVE BIOGEL M STRL SZ7.5 (GLOVE) IMPLANT
GLOVE SS BIOGEL STRL SZ 7 (GLOVE) IMPLANT
GLOVE SS BIOGEL STRL SZ 8 (GLOVE) ×1 IMPLANT
GLOVE SURG SIGNA 7.5 PF LTX (GLOVE) IMPLANT
GOWN STRL REUS W/ TWL LRG LVL3 (GOWN DISPOSABLE) ×1 IMPLANT
GOWN STRL REUS W/ TWL XL LVL3 (GOWN DISPOSABLE) ×1 IMPLANT
GOWN STRL REUS W/TWL LRG LVL3 (GOWN DISPOSABLE) ×1
GOWN STRL REUS W/TWL XL LVL3 (GOWN DISPOSABLE) ×2
KIT BASIN OR (CUSTOM PROCEDURE TRAY) ×1 IMPLANT
KIT TURNOVER KIT B (KITS) ×1 IMPLANT
LOOP VASCLR MAXI BLUE 18IN ST (MISCELLANEOUS) IMPLANT
LOOP VASCULAR MAXI 18 BLUE (MISCELLANEOUS) ×1
LOOPS VASCLR MAXI BLUE 18IN ST (MISCELLANEOUS) ×1 IMPLANT
NDL 22X1.5 STRL (OR ONLY) (MISCELLANEOUS) IMPLANT
NEEDLE 22X1.5 STRL (OR ONLY) (MISCELLANEOUS) IMPLANT
NS IRRIG 1000ML POUR BTL (IV SOLUTION) ×1 IMPLANT
PACK ORTHO EXTREMITY (CUSTOM PROCEDURE TRAY) ×1 IMPLANT
PAD ARMBOARD 7.5X6 YLW CONV (MISCELLANEOUS) ×2 IMPLANT
PAD CAST 4YDX4 CTTN HI CHSV (CAST SUPPLIES) ×1 IMPLANT
PADDING CAST ABS COTTON 4X4 ST (CAST SUPPLIES) IMPLANT
PADDING CAST COTTON 4X4 STRL (CAST SUPPLIES) ×1
PROS LCP PLATE 9H 124M (Plate) ×1 IMPLANT
PROSTHESIS LCP PLATE 9H 124M (Plate) IMPLANT
SCREW LOCK CORT ST 3.5X16 (Screw) IMPLANT
SCREW LOCK CORT ST 3.5X18 (Screw) IMPLANT
SCREW LOCK CORT ST 3.5X20 (Screw) IMPLANT
SOL PREP POV-IOD 4OZ 10% (MISCELLANEOUS) ×1 IMPLANT
SOL SCRUB PVP POV-IOD 4OZ 7.5% (MISCELLANEOUS) ×1
SOLUTION SCRB POV-IOD 4OZ 7.5% (MISCELLANEOUS) ×1 IMPLANT
SPLINT PLASTER CAST XFAST 5X30 (CAST SUPPLIES) IMPLANT
SUT ETHILON 2 0 FS 18 (SUTURE) IMPLANT
SUT ETHILON 3 0 PS 1 (SUTURE) IMPLANT
SUT PROLENE 3 0 PS 2 (SUTURE) ×1 IMPLANT
SUT VIC AB 2-0 CT1 27 (SUTURE) ×1
SUT VIC AB 2-0 CT1 TAPERPNT 27 (SUTURE) IMPLANT
SUT VIC AB 3-0 FS2 27 (SUTURE) IMPLANT
SYR CONTROL 10ML LL (SYRINGE) IMPLANT
SYSTEM CHEST DRAIN TLS 7FR (DRAIN) IMPLANT
TOWEL GREEN STERILE (TOWEL DISPOSABLE) ×1 IMPLANT
TOWEL GREEN STERILE FF (TOWEL DISPOSABLE) ×1 IMPLANT
TUBE CONNECTING 12X1/4 (SUCTIONS) ×1 IMPLANT
TUBE EVACUATION TLS (MISCELLANEOUS) IMPLANT
VASCULAR TIE MAXI BLUE 18IN ST (MISCELLANEOUS) ×1
WATER STERILE IRR 1000ML POUR (IV SOLUTION) ×1 IMPLANT
YANKAUER SUCT BULB TIP NO VENT (SUCTIONS) IMPLANT

## 2023-02-21 NOTE — Discharge Instructions (Signed)
[facility] Post-Operative Instructions    General -These instructions are to compliment information given to you by your surgeon. -You may resume your normal diet as tolerated.  -You may resume your normal medications unless specifically instructed to stop taking a certain medication. -If you are not sure about restarting one of your medications after surgery, please contact the office during normal business hours and we will be able to assist you.   Post-Operative Dressings Splint: If you have a splint on your operative arm, it should stay on with all the dressings over it until you return for follow-up. It is ok to take a shower while you are wearing your splint, as long as you do not get it wet. The splint must be covered and kept clean and dry. If your splint gets wet, please call the clinic as you may need to come to clinic early for this to be changed.   If you have questions about your dressings, please call the clinic.   Post-operative Wound Care In general:  Any sutures or anything that will not fall off over time on their own, will be removed in clinic by our staff.  -If your surgical wound/incision was closed with non-absorbable sutures or staples, they will be removed at your first post-op visit.   -If your incision was closed with absorbable sutures, they do not need to be removed as they will dissolve on their own. They may be visible or buried underneath the skin.    You may have one of the following over your incision/wound.  They can all get wet and should remain in place until they fall off on their own. -Small stickers called steri-strips.  You do not need to remove them. If the ends start to peel up, you can carefully trim them with scissors. -Skin glue often known by the brand name "Dermabond".  Do not remove, it will fall off over time.  Certain procedures such as fracture fixation may require the use of pins that  stick out through the skin.  If you have visible pins, please be careful to not bump them or move them.    Regardless of any sutures, stickers or glue used to close your wound, do not submerge the wound in any standing water for at least 4 weeks after surgery.  It is okay to let the shower water run over the wound and gently clean the wound with soapy water then rinse and gently pat dry.   Weightbearing/Activity  Do not use your operative extremity for any lifting or weight bearing   Please start finger range of motion exercises right away.  If you have a splint in place, you should move all joints that are not immobilized by a splint.   Pain Control - After your surgery, post-surgical discomfort or pain is normal. This discomfort can last several days to a few weeks. At certain times of the day (usually evenings/nights) your discomfort may be more intense.  - Do not drive while taking narcotic pain medications.   General Anesthesia or Bier Block: If you did not receive a nerve block during your surgery, you will need to start taking your pain medication shortly after your surgery and should continue to do so as prescribed by your surgeon.  Nerve Block:  If you received a nerve block, it may provide pain relief for one hour to up to two days after your surgery. As long as the nerve  block is working, you will experience little or no sensation in the area the surgeon operated on.  As the nerve block wears off, you will begin to experience pain or discomfort. It is very important that you begin taking your prescribed pain medication before the nerve block fully wears off.  Treating your pain at the first sign of the block wearing off will ensure your pain is better controlled and more tolerable when full-sensation returns. Do not wait until the pain is intolerable, as the medicine will be less effective. It is better to treat pain in advance than to try and catch up.  If the nerve block made your  entire arm numb, you will be given an arm sling that you should wear until the block wears off, but no longer than 2 days unless otherwise instructed.   Pain Medication:  Typically, post operative pain can be managed by alternating tylenol and an anti inflammatory medication.   We may also prescribe an opioid pain medication such as Oxycodone, Percocet (oxycodone with Tylenol) or Norco (hydrocodone with Tylenol) for post-operative pain. Some of these medications contain Tylenol (acetaminophen) in them.  It takes between 30 and 45 minutes before pain medication starts to work. It is important to take your medication before your pain level gets too intense.   Nausea is a common side effect of many pain medications. You will want to eat something before taking your pain medicine to help prevent nausea.   If you are taking a prescription opioid pain medication that contains acetaminophen (Tylenol), we recommend that you do not take additional over the counter acetaminophen (Tylenol).   If you are prescribed oxycodone WITHOUT acetaminophen (Tylenol) in it and you do not have a known allergy, we recommend you take over-the-counter acetaminophen (Tylenol) with the oxycodone.  We recommend taking two Extra Strength Tylenol (500mg ) tablets every 8 hours.  Take over-the-counter stool softener such as Colace or Sennakot while taking narcotic pain medications to help prevent constipation.    Other pain relieving options:  Elevation: Elevating your operative extremity can be very helpful to reduce this pain. Prop your arm up on pillows to keep the operative site above the level of your heart. If you develop tingling from prolonged elevation, take a break from elevating and this should resolve.  Icing: If you do not have a splint/cast, using a cold pack to ice the affected area a few times a day (15 to 20 minutes at a time) can also help to relieve pain, reduce swelling and bruising.   If you can take  nonsteroidal anti-inflammatory medications (NSAIDs, for example: Ibuprofen, Advil, Motrin, Aleve, etc.), you may take them to help control your pain. If you are unsure whether you can take anti-inflammatory medications, please check with your primary care provider.  If you are already taking a prescription of anti-inflammatory medications such as Celebrex (celecoxib) or Mobic (meloxicam) you should not take any additional anti-inflammatory drugs such as Ibuprofen, Advil, Motrin, or Aleve.    Follow Up Please call [facility] at [phone] if you do not receive or are unsure of your first follow-up appointment.  You should see your surgeon or PA 10-16 days after your surgery unless otherwise instructed.  No future appointments.   Please call the office for any problems, including the following:  - Excessive redness of the incisions - Drainage for more than 4 days - Fever of more than 101.5 F - Nausea/vomiting that does not stop  -  Numbness, tingling, or discoloration of extremity  - Unable to drink fluids  - Uncontrollable pain     Shaune Pollack, MD Hand and Upper Extremity Surgery [facility] [place] [phone]

## 2023-02-21 NOTE — H&P (Signed)
Orthopaedic Surgery Hand and Upper Extremity History and Physical Examination 02/21/2023  Referring Provider: No referring provider defined for this encounter.  CC: Right radial shaft fracture at site of prior hardware  HPI: Kevin Decker is a 42 y.o. male who presents today for surgery for his right radial shaft fracture around pre-existing hardware.  He is NPO.   Past Medical History: Past Medical History:  Diagnosis Date   Medical history non-contributory    Pulmonary embolism (HCC) 2018     Medications: Scheduled Meds:  acetaminophen       midazolam  2 mg Intravenous Once   Continuous Infusions:  ceFAZolin      ceFAZolin (ANCEF) IV     lactated ringers     PRN Meds:.acetaminophen, ceFAZolin  Allergies: Allergies as of 02/10/2023   (No Known Allergies)    Past Surgical History: Past Surgical History:  Procedure Laterality Date   APPENDECTOMY     KNEE ARTHROSCOPY Left 11/04/2016   Procedure: ARTHROSCOPY KNEE, Partial MEdial and Lateral Meniscectomy;  Surgeon: Donato Heinz, MD;  Location: [facility] ORS;  Service: Orthopedics;  Laterality: Left;   ORIF ULNAR FRACTURE Right 01/10/2016   Procedure: OPEN REDUCTION INTERNAL FIXATION (ORIF) ULNAR/RADIUS FRACTURE;  Surgeon: Donato Heinz, MD;  Location: [facility] ORS;  Service: Orthopedics;  Laterality: Right;   torn meniscus Left    WRIST FRACTURE SURGERY Right      Social History: Social History   Occupational History   Not on file  Tobacco Use   Smoking status: Never   Smokeless tobacco: Never  Substance and Sexual Activity   Alcohol use: Yes    Comment: social   Drug use: No   Sexual activity: Not on file     Family History: History reviewed. No pertinent family history. Otherwise, no relevant orthopaedic family history  ROS: Review of Systems: All systems reviewed and are negative except that mentioned in HPI  Work/Sport/Hobbies: See HPI  Physical Examination: Vitals:   02/21/23 0925 02/21/23 0930   BP: 126/81   Pulse: 86 83  Resp: 19 (!) 22  Temp:    SpO2: 93% 97%   Constitutional: Awake, alert.  WN/WD Appearance: healthy, no acute distress, well-groomed Affect: Normal HEENT: EOMI, mucous membranes moist CV: RRR Pulm: breathing comfortably   Right Upper Extremity / Hand Splint in place in acceptable state of repair.  Able to wiggle fingers.  Sensation intact in median, radial, and ulnar nerve distributions.  Motor intact in AIN, PIN, and ulnar nerve distributions.  Fingers are warm and well perfused.    Pertinent Labs: n/a  Imaging: I have personally reviewed the following studies: Prior images demonstrated radial shaft fracture just proximal to the distal end of the current plate.  Additional Studies: n/a  Assessment/Plan: Right radial shaft fracture around prior hardware.   Plan to take patient to OR for removal of prior hardware and ORIF right radial shaft fracture.    Shaune Pollack, MD Hand and Upper Extremity Surgery [facility] [place] [phone] 02/21/2023 9:33 AM

## 2023-02-21 NOTE — Anesthesia Procedure Notes (Signed)
Procedure Name: LMA Insertion Date/Time: 02/21/2023 10:40 AM  Performed by: Shary Decamp, CRNAPre-anesthesia Checklist: Patient identified, Emergency Drugs available, Suction available and Patient being monitored Patient Re-evaluated:Patient Re-evaluated prior to induction Oxygen Delivery Method: Circle system utilized Preoxygenation: Pre-oxygenation with 100% oxygen Induction Type: IV induction Ventilation: Mask ventilation without difficulty LMA: LMA flexible inserted LMA Size: 5.0 Number of attempts: 1 Placement Confirmation: positive ETCO2 and breath sounds checked- equal and bilateral Tube secured with: Tape Dental Injury: Teeth and Oropharynx as per pre-operative assessment

## 2023-02-21 NOTE — Transfer of Care (Signed)
Immediate Anesthesia Transfer of Care Note  Patient: Kevin Decker  Procedure(s) Performed: OPEN REDUCTION INTERNAL FIXATION (ORIF) DISTAL RADIUS FRACTURE RIGHT FOREARM (Right: Arm Lower) REMOVAL OF OLD HARDWARE DEEP (Right: Arm Lower)  Patient Location: PACU  Anesthesia Type:GA combined with regional for post-op pain  Level of Consciousness: drowsy and patient cooperative  Airway & Oxygen Therapy: Patient Spontanous Breathing and Patient connected to face mask oxygen  Post-op Assessment: Report given to RN and Post -op Vital signs reviewed and stable  Post vital signs: Reviewed and stable  Last Vitals:  Vitals Value Taken Time  BP    Temp    Pulse 87 02/21/23 1449  Resp    SpO2 97 % 02/21/23 1449  Vitals shown include unvalidated device data.  Last Pain:  Vitals:   02/21/23 0826  TempSrc:   PainSc: 0-No pain         Complications: No notable events documented.

## 2023-02-21 NOTE — Anesthesia Postprocedure Evaluation (Signed)
Anesthesia Post Note  Patient: Kevin Decker  Procedure(s) Performed: OPEN REDUCTION INTERNAL FIXATION (ORIF) DISTAL RADIUS FRACTURE RIGHT FOREARM (Right: Arm Lower) REMOVAL OF OLD HARDWARE DEEP (Right: Arm Lower)     Patient location during evaluation: PACU Anesthesia Type: Regional and General Level of consciousness: awake Pain management: pain level controlled Vital Signs Assessment: post-procedure vital signs reviewed and stable Respiratory status: spontaneous breathing, nonlabored ventilation and respiratory function stable Cardiovascular status: blood pressure returned to baseline and stable Postop Assessment: no apparent nausea or vomiting Anesthetic complications: no   No notable events documented.  Last Vitals:  Vitals:   02/21/23 1500 02/21/23 1515  BP: 113/70 124/80  Pulse: 87 92  Resp: 18 14  Temp:    SpO2: 95% 97%    Last Pain:  Vitals:   02/21/23 1515  TempSrc:   PainSc: 0-No pain                 Linton Rump

## 2023-02-21 NOTE — Brief Op Note (Signed)
02/21/2023  2:40 PM  PATIENT:  Kevin Decker  42 y.o. male  PRE-OPERATIVE DIAGNOSIS:  right radial shaft fracture  POST-OPERATIVE DIAGNOSIS:  right radial shaft fracture  PROCEDURE:  Procedure(s) with comments: OPEN REDUCTION INTERNAL FIXATION (ORIF) DISTAL RADIUS FRACTURE RIGHT FOREARM (Right) REMOVAL OF OLD HARDWARE DEEP (Right) - Anesthesia- Axillary Block  SURGEON:  Surgeon(s) and Role:    * Leshawn Houseworth, Edsel Petrin, MD - Primary  PHYSICIAN ASSISTANT:   ASSISTANTS: Annye Rusk, PA   ANESTHESIA:   regional and general  EBL:  30 mL   BLOOD ADMINISTERED:none  DRAINS: none   LOCAL MEDICATIONS USED:  None  SPECIMEN:  hardware for ID and suspected metallosis debris from plate bone interface for culture and pathology  DISPOSITION OF SPECIMEN:  PATHOLOGY  COUNTS:  YES  TOURNIQUET:  * Missing tourniquet times found for documented tourniquets in log: [id number] * Total Tourniquet Time Documented: Upper Arm (Right) - 120 minutes Total: Upper Arm (Right) - 120 minutes   PLAN OF CARE: Discharge to home after PACU  PATIENT DISPOSITION:  PACU - hemodynamically stable.   Delay start of Pharmacological VTE agent (>24hrs) due to surgical blood loss or risk of bleeding: not applicable   Shaune Pollack, MD Hand & Upper Extremity Surgery [facility] [place] [phone]

## 2023-02-21 NOTE — Anesthesia Procedure Notes (Signed)
Anesthesia Regional Block: Supraclavicular block   Pre-Anesthetic Checklist: , timeout performed,  Correct Patient, Correct Site, Correct Laterality,  Correct Procedure, Correct Position, site marked,  Risks and benefits discussed,  Surgical consent,  Pre-op evaluation,  At surgeon's request and post-op pain management  Laterality: Right  Prep: chloraprep       Needles:  Injection technique: Single-shot  Needle Type: Echogenic Stimulator Needle     Needle Length: 9cm  Needle Gauge: 21     Additional Needles:   Procedures:,,,, ultrasound used (permanent image in chart),,    Narrative:  Start time: 02/21/2023 9:15 AM End time: 02/21/2023 9:18 AM Injection made incrementally with aspirations every 5 mL.  Performed by: Personally  Anesthesiologist: Linton Rump, MD  Additional Notes: Discussed risks and benefits of nerve block including, but not limited to, prolonged and/or permanent nerve injury involving sensory and/or motor function. Monitors were applied and a time-out was performed. The nerve and associated structures were visualized under ultrasound guidance. After negative aspiration, local anesthetic was slowly injected around the nerve. There was no evidence of high pressure during the procedure. There were no paresthesias. VSS remained stable and the patient tolerated the procedure well.

## 2023-02-22 NOTE — Op Note (Addendum)
NAME: Corde Mourer MEDICAL RECORD NO: [id number] DATE OF BIRTH: 17-Sep-1981 FACILITY: [facility] LOCATION: [facility] OR PHYSICIAN: Ramon Dredge MD   OPERATIVE REPORT   DATE OF PROCEDURE: 02/21/23    PREOPERATIVE DIAGNOSIS:  Right radial shaft fracture at site of prior hardware   POSTOPERATIVE DIAGNOSIS:  same   PROCEDURE:  Open reduction internal fixation of right radial shaft and removal of prior hardware   SURGEON: Edsel Petrin. Arnol Mcgibbon, M.D.   ASSISTANT: Annye Rusk, [facility]   ANESTHESIA:  General with regional   INTRAVENOUS FLUIDS:  Per anesthesia flow sheet.   ESTIMATED BLOOD LOSS:  Minimal.   COMPLICATIONS:  Condensation from the ceiling dripped down and landed on Robert Dasnoit's glove.  He immediately changed his gloves.  Later, another drop of condensation landed on the back table.  This area was covered with tegaderm.  The OR table and the back table were both shifted to not be under these areas.  No condensation landed in the wound.   SPECIMENS:  prior hardware for gross ID, metallosis debris for path and cultures   TOURNIQUET TIME:   120 minutes then down for 25 minutes then up for 30 minutes    DISPOSITION:  Stable to PACU.   INDICATIONS:  This is a 42 year old male who sustained a right radial shaft fracture at the distal most screw hole from hardware from prior both bone forearm fracture fixation.  OPERATIVE COURSE:  The patient was identified in holding and the site, side and surgery were confirmed.  The risks, benefits, and alternatives were reviewed and informed consent was obtained.    The Anesthesia team placed a regional nerve block.  The patient was brought to the operating room and transferred to the OR table with the right upper extremity outstretched on a radiolucent arm board.  A well padded tourniquet was placed on the right upper arm.  Anesthesia was induced.    The patient was prepped and draped in the typical sterile fashion.  Esmarch  bandage was used to exsanguinate the arm and the tourniquet was inflated.  The prior volar henry incision was utilized and extended distally to approximately 2cm proximal to the wrist flexion crease.    Keloid scar was sharply excised.  There was significant subcutaneous scar tissue encountered throughout the length of the original incision.  The radial artery was encased in significant scar tissue and was in a more superficial plane due to the prior surgery.  The radial artery was dissected out and protected and retracted ulnarly.    The interval between brachioradialis and pronator teres was filled with scar tissue but was carefully dissected out.  The superficial radial nerve was also found to be encased in scar tissue and was carefully dissected out and protected.  The fracture site was found.  The plate was then dissected out and was found to be on the radial aspect of the radial shaft.  The plate was removed.  There appeared to be metallosis type debris around the proximal most screw holes.  This was ronguered and sent for cultures and path.  Lobster claw clamps were used to manipulate the bone ends to be able to curette the callus and hematoma.  The fracture was then reduced under direct visualization and checked with fluoroscopy.  An appropriately sized Synthes 3.66mm LCP plate was selected and was test fit to the radius.  The tourniquet reached 120 minutes so it was deflated.  No major arterial bleeders were found.  Adequate hemostasis  was achieved with bipolar electrocautery.  The plate was then bent with plate benders to match the curvature of the reduced fracture.  This was checked under direct visualization and fluoroscopy.  Holes through the plate were drilled and then filled with screws.  After 25 minutes, the tourniquet was reinflated.  The wound was thoroughly irrigated with normal saline.  The tourniquet was dropped after 30 minutes and the wound was found to have adequate hemostasis.  The  interval over the plate was closed with 2-0 vicryl sutures.  The deep subcutaneous layer was closed with 2-0 vicryl and then skin was closed with 3-0 nylons.  The wound was dressed with xeroform, gauze, kerlix, and sterile webril.  A long arm splint was then applied.  The patient was then awakened and taken to PACU.  Counts were correct x2.     Post op plan: Patient will remain nonweight bearing on the right upper extremity.  No lifting or weight bearing.  Follow up in clinic in approximately 10 days.   Ramon Dredge, MD Electronically signed, 02/22/23

## 2023-02-23 LAB — AEROBIC/ANAEROBIC CULTURE W GRAM STAIN (SURGICAL/DEEP WOUND)

## 2023-02-24 ENCOUNTER — Encounter (HOSPITAL_COMMUNITY): Payer: Self-pay | Admitting: Orthopedic Surgery

## 2023-02-24 LAB — AEROBIC/ANAEROBIC CULTURE W GRAM STAIN (SURGICAL/DEEP WOUND)

## 2023-02-24 LAB — SURGICAL PATHOLOGY

## 2023-02-26 LAB — AEROBIC/ANAEROBIC CULTURE W GRAM STAIN (SURGICAL/DEEP WOUND)
Culture: NO GROWTH
Gram Stain: NONE SEEN
